# Patient Record
Sex: Female | Born: 1965 | Race: White | Hispanic: No | Marital: Married | Smoking: Never smoker
Health system: Southern US, Community
[De-identification: ages and names within clinical notes are randomized; demographics above are authoritative.]

## PROBLEM LIST (undated history)

## (undated) DIAGNOSIS — G629 Polyneuropathy, unspecified: Secondary | ICD-10-CM

## (undated) DIAGNOSIS — K769 Liver disease, unspecified: Secondary | ICD-10-CM

## (undated) DIAGNOSIS — F1010 Alcohol abuse, uncomplicated: Secondary | ICD-10-CM

## (undated) DIAGNOSIS — I509 Heart failure, unspecified: Secondary | ICD-10-CM

## (undated) HISTORY — PX: ABDOMINAL HYSTERECTOMY: SHX81

## (undated) HISTORY — PX: TONSILLECTOMY: SUR1361

## (undated) HISTORY — PX: BREAST LUMPECTOMY: SHX2

---

## 2002-08-19 ENCOUNTER — Emergency Department (HOSPITAL_COMMUNITY): Admission: EM | Admit: 2002-08-19 | Discharge: 2002-08-19 | Payer: Self-pay | Admitting: Emergency Medicine

## 2002-08-24 ENCOUNTER — Emergency Department (HOSPITAL_COMMUNITY): Admission: EM | Admit: 2002-08-24 | Discharge: 2002-08-24 | Payer: Self-pay | Admitting: Emergency Medicine

## 2002-09-19 ENCOUNTER — Emergency Department (HOSPITAL_COMMUNITY): Admission: EM | Admit: 2002-09-19 | Discharge: 2002-09-19 | Payer: Self-pay | Admitting: Emergency Medicine

## 2002-10-01 ENCOUNTER — Emergency Department (HOSPITAL_COMMUNITY): Admission: EM | Admit: 2002-10-01 | Discharge: 2002-10-01 | Payer: Self-pay | Admitting: Emergency Medicine

## 2002-10-16 ENCOUNTER — Emergency Department (HOSPITAL_COMMUNITY): Admission: EM | Admit: 2002-10-16 | Discharge: 2002-10-17 | Payer: Self-pay | Admitting: Emergency Medicine

## 2002-11-01 ENCOUNTER — Emergency Department (HOSPITAL_COMMUNITY): Admission: EM | Admit: 2002-11-01 | Discharge: 2002-11-01 | Payer: Self-pay | Admitting: Emergency Medicine

## 2002-11-14 ENCOUNTER — Emergency Department (HOSPITAL_COMMUNITY): Admission: EM | Admit: 2002-11-14 | Discharge: 2002-11-14 | Payer: Self-pay | Admitting: Emergency Medicine

## 2002-12-12 ENCOUNTER — Emergency Department (HOSPITAL_COMMUNITY): Admission: EM | Admit: 2002-12-12 | Discharge: 2002-12-12 | Payer: Self-pay | Admitting: Emergency Medicine

## 2002-12-21 ENCOUNTER — Emergency Department (HOSPITAL_COMMUNITY): Admission: EM | Admit: 2002-12-21 | Discharge: 2002-12-21 | Payer: Self-pay | Admitting: Emergency Medicine

## 2002-12-30 ENCOUNTER — Emergency Department (HOSPITAL_COMMUNITY): Admission: AD | Admit: 2002-12-30 | Discharge: 2002-12-30 | Payer: Self-pay | Admitting: Family Medicine

## 2003-01-01 ENCOUNTER — Emergency Department (HOSPITAL_COMMUNITY): Admission: AD | Admit: 2003-01-01 | Discharge: 2003-01-01 | Payer: Self-pay | Admitting: Family Medicine

## 2003-01-01 ENCOUNTER — Emergency Department (HOSPITAL_COMMUNITY): Admission: EM | Admit: 2003-01-01 | Discharge: 2003-01-01 | Payer: Self-pay | Admitting: Emergency Medicine

## 2003-01-22 ENCOUNTER — Emergency Department (HOSPITAL_COMMUNITY): Admission: EM | Admit: 2003-01-22 | Discharge: 2003-01-22 | Payer: Self-pay | Admitting: Emergency Medicine

## 2003-02-12 ENCOUNTER — Emergency Department (HOSPITAL_COMMUNITY): Admission: EM | Admit: 2003-02-12 | Discharge: 2003-02-12 | Payer: Self-pay | Admitting: Emergency Medicine

## 2003-03-02 ENCOUNTER — Emergency Department (HOSPITAL_COMMUNITY): Admission: EM | Admit: 2003-03-02 | Discharge: 2003-03-02 | Payer: Self-pay | Admitting: Emergency Medicine

## 2003-03-12 ENCOUNTER — Emergency Department (HOSPITAL_COMMUNITY): Admission: EM | Admit: 2003-03-12 | Discharge: 2003-03-12 | Payer: Self-pay | Admitting: Emergency Medicine

## 2003-03-21 ENCOUNTER — Emergency Department (HOSPITAL_COMMUNITY): Admission: EM | Admit: 2003-03-21 | Discharge: 2003-03-21 | Payer: Self-pay | Admitting: Emergency Medicine

## 2003-03-27 ENCOUNTER — Emergency Department (HOSPITAL_COMMUNITY): Admission: EM | Admit: 2003-03-27 | Discharge: 2003-03-28 | Payer: Self-pay | Admitting: Emergency Medicine

## 2003-04-05 ENCOUNTER — Emergency Department (HOSPITAL_COMMUNITY): Admission: EM | Admit: 2003-04-05 | Discharge: 2003-04-05 | Payer: Self-pay | Admitting: Emergency Medicine

## 2003-04-07 ENCOUNTER — Emergency Department (HOSPITAL_COMMUNITY): Admission: EM | Admit: 2003-04-07 | Discharge: 2003-04-07 | Payer: Self-pay | Admitting: Emergency Medicine

## 2003-12-31 ENCOUNTER — Ambulatory Visit: Payer: Self-pay | Admitting: Family Medicine

## 2004-08-04 ENCOUNTER — Ambulatory Visit: Payer: Self-pay | Admitting: Orthopedic Surgery

## 2004-12-27 ENCOUNTER — Emergency Department: Payer: Self-pay | Admitting: Unknown Physician Specialty

## 2005-11-04 ENCOUNTER — Emergency Department: Payer: Self-pay | Admitting: Emergency Medicine

## 2005-12-13 ENCOUNTER — Emergency Department: Payer: Self-pay | Admitting: Emergency Medicine

## 2005-12-27 ENCOUNTER — Emergency Department: Payer: Self-pay | Admitting: Emergency Medicine

## 2006-01-05 ENCOUNTER — Emergency Department: Payer: Self-pay | Admitting: Unknown Physician Specialty

## 2006-02-23 ENCOUNTER — Emergency Department: Payer: Self-pay | Admitting: Emergency Medicine

## 2006-02-28 ENCOUNTER — Emergency Department: Payer: Self-pay | Admitting: Emergency Medicine

## 2006-03-25 ENCOUNTER — Emergency Department: Payer: Self-pay

## 2006-03-26 ENCOUNTER — Emergency Department: Payer: Self-pay | Admitting: General Practice

## 2006-06-13 ENCOUNTER — Emergency Department: Payer: Self-pay | Admitting: Emergency Medicine

## 2006-09-20 ENCOUNTER — Emergency Department: Payer: Self-pay | Admitting: Emergency Medicine

## 2006-12-24 ENCOUNTER — Emergency Department: Payer: Self-pay | Admitting: Emergency Medicine

## 2007-02-27 ENCOUNTER — Emergency Department: Payer: Self-pay | Admitting: Emergency Medicine

## 2007-03-25 ENCOUNTER — Emergency Department: Payer: Self-pay | Admitting: Emergency Medicine

## 2007-06-30 ENCOUNTER — Emergency Department: Payer: Self-pay | Admitting: Emergency Medicine

## 2007-07-26 ENCOUNTER — Emergency Department: Payer: Self-pay | Admitting: Emergency Medicine

## 2007-08-24 ENCOUNTER — Emergency Department: Payer: Self-pay | Admitting: Emergency Medicine

## 2007-08-27 ENCOUNTER — Other Ambulatory Visit: Payer: Self-pay

## 2007-08-27 ENCOUNTER — Inpatient Hospital Stay: Payer: Self-pay | Admitting: Internal Medicine

## 2007-08-28 ENCOUNTER — Inpatient Hospital Stay: Payer: Self-pay | Admitting: Psychiatry

## 2007-10-21 ENCOUNTER — Emergency Department: Payer: Self-pay | Admitting: Emergency Medicine

## 2007-10-25 ENCOUNTER — Emergency Department: Payer: Self-pay | Admitting: Emergency Medicine

## 2007-12-14 ENCOUNTER — Other Ambulatory Visit: Payer: Self-pay

## 2007-12-14 ENCOUNTER — Emergency Department: Payer: Self-pay | Admitting: Emergency Medicine

## 2008-01-28 ENCOUNTER — Emergency Department: Payer: Self-pay | Admitting: Emergency Medicine

## 2008-01-29 ENCOUNTER — Emergency Department: Payer: Self-pay | Admitting: Emergency Medicine

## 2008-04-01 ENCOUNTER — Emergency Department: Payer: Self-pay | Admitting: Emergency Medicine

## 2008-04-03 ENCOUNTER — Emergency Department: Payer: Self-pay | Admitting: Emergency Medicine

## 2008-05-25 ENCOUNTER — Emergency Department: Payer: Self-pay | Admitting: Emergency Medicine

## 2008-05-30 ENCOUNTER — Emergency Department: Payer: Self-pay | Admitting: Emergency Medicine

## 2008-06-22 ENCOUNTER — Emergency Department: Payer: Self-pay | Admitting: Unknown Physician Specialty

## 2008-07-15 ENCOUNTER — Emergency Department: Payer: Self-pay | Admitting: Emergency Medicine

## 2008-07-18 ENCOUNTER — Emergency Department: Payer: Self-pay | Admitting: Internal Medicine

## 2008-07-19 ENCOUNTER — Emergency Department: Payer: Self-pay | Admitting: Emergency Medicine

## 2008-08-16 ENCOUNTER — Emergency Department: Payer: Self-pay | Admitting: Emergency Medicine

## 2008-08-19 ENCOUNTER — Inpatient Hospital Stay: Payer: Self-pay | Admitting: Internal Medicine

## 2008-08-20 ENCOUNTER — Inpatient Hospital Stay: Payer: Self-pay | Admitting: Psychiatry

## 2008-09-03 ENCOUNTER — Emergency Department: Payer: Self-pay | Admitting: Emergency Medicine

## 2008-09-06 ENCOUNTER — Emergency Department: Payer: Self-pay | Admitting: Emergency Medicine

## 2008-09-28 ENCOUNTER — Inpatient Hospital Stay: Payer: Self-pay | Admitting: Internal Medicine

## 2008-09-28 ENCOUNTER — Ambulatory Visit: Payer: Self-pay | Admitting: Cardiovascular Disease

## 2008-09-30 ENCOUNTER — Inpatient Hospital Stay: Payer: Self-pay | Admitting: Psychiatry

## 2008-11-05 ENCOUNTER — Emergency Department: Payer: Self-pay

## 2008-11-07 ENCOUNTER — Emergency Department: Payer: Self-pay | Admitting: Emergency Medicine

## 2008-12-22 ENCOUNTER — Emergency Department: Payer: Self-pay | Admitting: Emergency Medicine

## 2009-01-18 ENCOUNTER — Emergency Department: Payer: Self-pay | Admitting: Emergency Medicine

## 2009-02-16 ENCOUNTER — Emergency Department: Payer: Self-pay | Admitting: Emergency Medicine

## 2009-04-05 ENCOUNTER — Emergency Department: Payer: Self-pay | Admitting: Emergency Medicine

## 2010-12-15 ENCOUNTER — Emergency Department: Payer: Self-pay | Admitting: Internal Medicine

## 2011-03-10 ENCOUNTER — Emergency Department: Payer: Self-pay | Admitting: Unknown Physician Specialty

## 2012-03-17 ENCOUNTER — Emergency Department: Payer: Self-pay | Admitting: Emergency Medicine

## 2013-04-07 ENCOUNTER — Emergency Department: Payer: Self-pay | Admitting: Emergency Medicine

## 2013-04-07 LAB — DRUG SCREEN, URINE
AMPHETAMINES, UR SCREEN: NEGATIVE (ref ?–1000)
Barbiturates, Ur Screen: POSITIVE (ref ?–200)
Benzodiazepine, Ur Scrn: NEGATIVE (ref ?–200)
CANNABINOID 50 NG, UR ~~LOC~~: NEGATIVE (ref ?–50)
COCAINE METABOLITE, UR ~~LOC~~: NEGATIVE (ref ?–300)
MDMA (Ecstasy)Ur Screen: NEGATIVE (ref ?–500)
Methadone, Ur Screen: NEGATIVE (ref ?–300)
Opiate, Ur Screen: NEGATIVE (ref ?–300)
Phencyclidine (PCP) Ur S: NEGATIVE (ref ?–25)
TRICYCLIC, UR SCREEN: NEGATIVE (ref ?–1000)

## 2013-04-07 LAB — COMPREHENSIVE METABOLIC PANEL
ALK PHOS: 238 U/L — AB
ALT: 42 U/L (ref 12–78)
Albumin: 2.9 g/dL — ABNORMAL LOW (ref 3.4–5.0)
Anion Gap: 12 (ref 7–16)
BUN: 9 mg/dL (ref 7–18)
Bilirubin,Total: 0.7 mg/dL (ref 0.2–1.0)
CHLORIDE: 95 mmol/L — AB (ref 98–107)
Calcium, Total: 8.7 mg/dL (ref 8.5–10.1)
Co2: 27 mmol/L (ref 21–32)
Creatinine: 0.59 mg/dL — ABNORMAL LOW (ref 0.60–1.30)
Glucose: 112 mg/dL — ABNORMAL HIGH (ref 65–99)
Osmolality: 268 (ref 275–301)
Potassium: 3.3 mmol/L — ABNORMAL LOW (ref 3.5–5.1)
SGOT(AST): 62 U/L — ABNORMAL HIGH (ref 15–37)
SODIUM: 134 mmol/L — AB (ref 136–145)
TOTAL PROTEIN: 6.6 g/dL (ref 6.4–8.2)

## 2013-04-07 LAB — URINALYSIS, COMPLETE
BACTERIA: NONE SEEN
Glucose,UR: 50 mg/dL (ref 0–75)
Hyaline Cast: 14
NITRITE: NEGATIVE
Ph: 5 (ref 4.5–8.0)
Protein: 100
Specific Gravity: 1.026 (ref 1.003–1.030)
Squamous Epithelial: 31

## 2013-04-07 LAB — CBC
HCT: 35.3 % (ref 35.0–47.0)
HGB: 11.9 g/dL — ABNORMAL LOW (ref 12.0–16.0)
MCH: 37.6 pg — ABNORMAL HIGH (ref 26.0–34.0)
MCHC: 33.6 g/dL (ref 32.0–36.0)
MCV: 112 fL — AB (ref 80–100)
PLATELETS: 250 10*3/uL (ref 150–440)
RBC: 3.16 10*6/uL — AB (ref 3.80–5.20)
RDW: 17.7 % — AB (ref 11.5–14.5)
WBC: 8.2 10*3/uL (ref 3.6–11.0)

## 2013-04-07 LAB — ETHANOL: Ethanol %: <0.003 % (ref 0.000–0.080)

## 2013-04-07 LAB — TROPONIN I: Troponin-I: <0.02 ng/mL

## 2013-04-07 LAB — TSH: THYROID STIMULATING HORM: 1.6 u[IU]/mL

## 2013-04-07 LAB — ACETAMINOPHEN LEVEL: Acetaminophen: <2 ug/mL

## 2013-04-07 LAB — AMMONIA: AMMONIA, PLASMA: 22 umol/L (ref 11–32)

## 2013-04-07 LAB — SALICYLATE LEVEL: Salicylates, Serum: <1.7 mg/dL

## 2013-04-07 LAB — T4, FREE: Free Thyroxine: 1.4 ng/dL (ref 0.76–1.46)

## 2013-04-08 LAB — PREGNANCY, URINE: PREGNANCY TEST, URINE: NEGATIVE m[IU]/mL

## 2013-08-05 ENCOUNTER — Emergency Department: Payer: Self-pay | Admitting: Emergency Medicine

## 2014-05-14 ENCOUNTER — Emergency Department: Payer: Self-pay | Admitting: Emergency Medicine

## 2017-04-06 ENCOUNTER — Emergency Department
Admission: EM | Admit: 2017-04-06 | Discharge: 2017-04-07 | Disposition: A | Discharge status: Home / Self Care | Attending: Emergency Medicine | Admitting: Emergency Medicine

## 2017-04-06 ENCOUNTER — Encounter: Payer: Self-pay | Admitting: Emergency Medicine

## 2017-04-06 ENCOUNTER — Emergency Department

## 2017-04-06 DIAGNOSIS — Z7901 Long term (current) use of anticoagulants: Secondary | ICD-10-CM | POA: Diagnosis not present

## 2017-04-06 DIAGNOSIS — I509 Heart failure, unspecified: Secondary | ICD-10-CM | POA: Diagnosis not present

## 2017-04-06 DIAGNOSIS — F1010 Alcohol abuse, uncomplicated: Secondary | ICD-10-CM | POA: Insufficient documentation

## 2017-04-06 DIAGNOSIS — Z79899 Other long term (current) drug therapy: Secondary | ICD-10-CM | POA: Insufficient documentation

## 2017-04-06 DIAGNOSIS — R000 Tachycardia, unspecified: Secondary | ICD-10-CM | POA: Diagnosis not present

## 2017-04-06 DIAGNOSIS — Z046 Encounter for general psychiatric examination, requested by authority: Secondary | ICD-10-CM | POA: Diagnosis present

## 2017-04-06 DIAGNOSIS — F329 Major depressive disorder, single episode, unspecified: Secondary | ICD-10-CM | POA: Insufficient documentation

## 2017-04-06 HISTORY — DX: Polyneuropathy, unspecified: G62.9

## 2017-04-06 HISTORY — DX: Heart failure, unspecified: I50.9

## 2017-04-06 HISTORY — DX: Liver disease, unspecified: K76.9

## 2017-04-06 LAB — ETHANOL: Alcohol, Ethyl (B): 139 mg/dL — ABNORMAL HIGH (ref <10)

## 2017-04-06 LAB — URINE DRUG SCREEN, QUALITATIVE (ARMC ONLY)
AMPHETAMINES, UR SCREEN: NOT DETECTED
Barbiturates, Ur Screen: NOT DETECTED
Benzodiazepine, Ur Scrn: NOT DETECTED
COCAINE METABOLITE, UR ~~LOC~~: NOT DETECTED
Cannabinoid 50 Ng, Ur ~~LOC~~: NOT DETECTED
MDMA (Ecstasy)Ur Screen: NOT DETECTED
METHADONE SCREEN, URINE: NOT DETECTED
Opiate, Ur Screen: NOT DETECTED
PHENCYCLIDINE (PCP) UR S: NOT DETECTED
Tricyclic, Ur Screen: NOT DETECTED

## 2017-04-06 LAB — COMPREHENSIVE METABOLIC PANEL
ALBUMIN: 3.1 g/dL — AB (ref 3.5–5.0)
ALK PHOS: 212 U/L — AB (ref 38–126)
ALT: 51 U/L (ref 14–54)
ANION GAP: 6 (ref 5–15)
AST: 61 U/L — ABNORMAL HIGH (ref 15–41)
BUN: 17 mg/dL (ref 6–20)
CALCIUM: 8.5 mg/dL — AB (ref 8.9–10.3)
CO2: 31 mmol/L (ref 22–32)
Chloride: 104 mmol/L (ref 101–111)
Creatinine, Ser: 0.65 mg/dL (ref 0.44–1.00)
GFR calc Af Amer: >60 mL/min (ref >60)
GFR calc non Af Amer: >60 mL/min (ref >60)
GLUCOSE: 86 mg/dL (ref 65–99)
Potassium: 5.1 mmol/L (ref 3.5–5.1)
Sodium: 141 mmol/L (ref 135–145)
TOTAL PROTEIN: 6.7 g/dL (ref 6.5–8.1)
Total Bilirubin: 0.2 mg/dL — ABNORMAL LOW (ref 0.3–1.2)

## 2017-04-06 LAB — CBC
HCT: 35.8 % (ref 35.0–47.0)
Hemoglobin: 11.7 g/dL — ABNORMAL LOW (ref 12.0–16.0)
MCH: 28.8 pg (ref 26.0–34.0)
MCHC: 32.7 g/dL (ref 32.0–36.0)
MCV: 87.9 fL (ref 80.0–100.0)
Platelets: 400 10*3/uL (ref 150–440)
RBC: 4.07 MIL/uL (ref 3.80–5.20)
RDW: 21.3 % — ABNORMAL HIGH (ref 11.5–14.5)
WBC: 8.6 10*3/uL (ref 3.6–11.0)

## 2017-04-06 MED ORDER — OXYCODONE HCL 5 MG PO TABS
5.0000 mg | ORAL_TABLET | Freq: Once | ORAL | Status: AC
  Administered 2017-04-06: 5 mg via ORAL
  Filled 2017-04-06: qty 1

## 2017-04-06 NOTE — ED Notes (Addendum)
Pt resting in room; calm and cooperative; watching tv

## 2017-04-06 NOTE — ED Notes (Signed)
Patient's belongings locked up. Patient has 2 yellow color rings, one yellow color necklace with charm, 1 watch, glasses, cell phone and clothes.

## 2017-04-06 NOTE — ED Triage Notes (Signed)
[facility] Patient denies SI at this time.
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
## 2017-04-06 NOTE — ED Notes (Signed)
[facility]

## 2017-04-06 NOTE — ED Notes (Signed)
Pt resting in hallway bed, tearful; awaiting assessment

## 2017-04-06 NOTE — ED Notes (Signed)
Spoke with Dr Scotty CourtStafford regarding pt's request for Fioricet; pt understands that will not be ordered for her at this time; pt requesting "Tylenol or something"; will discuss with MD

## 2017-04-06 NOTE — ED Notes (Signed)
Pt resting in bed, calm and cooperative; understands no new orders for migraine; lights out for pt comfort; [facility]; pt says "I just want to go home"
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
## 2017-04-06 NOTE — ED Notes (Signed)
Pt out to desk asking for medication for migraine, says MD was going to order something; informed pt she was just given oxycodone but she says that does not work for her headache; will discuss with Dr Scotty CourtStafford

## 2017-04-06 NOTE — ED Notes (Signed)
Dr Scotty CourtStafford at bedside to assess

## 2017-04-06 NOTE — ED Notes (Signed)
Spoke with Dr Scotty CourtStafford regarding pt's request for "tylenol or something"; no new orders given at this time

## 2017-04-07 MED ORDER — SEVELAMER CARBONATE 800 MG PO TABS
800.0000 mg | ORAL_TABLET | Freq: Three times a day (TID) | ORAL | Status: DC
  Administered 2017-04-07 (×2): 800 mg via ORAL
  Filled 2017-04-07 (×2): qty 1

## 2017-04-07 MED ORDER — SODIUM CHLORIDE 0.9 % IV BOLUS (SEPSIS)
1000.0000 mL | Freq: Once | INTRAVENOUS | Status: AC
  Administered 2017-04-07: 1000 mL via INTRAVENOUS

## 2017-04-07 MED ORDER — RIFAXIMIN 550 MG PO TABS
550.0000 mg | ORAL_TABLET | Freq: Once | ORAL | Status: AC
  Administered 2017-04-07: 550 mg via ORAL
  Filled 2017-04-07: qty 1

## 2017-04-07 MED ORDER — CARVEDILOL 6.25 MG PO TABS
12.5000 mg | ORAL_TABLET | Freq: Two times a day (BID) | ORAL | Status: DC
  Administered 2017-04-07: 12.5 mg via ORAL

## 2017-04-07 MED ORDER — IBUPROFEN 800 MG PO TABS
800.0000 mg | ORAL_TABLET | Freq: Once | ORAL | Status: AC
  Administered 2017-04-07: 800 mg via ORAL
  Filled 2017-04-07: qty 1

## 2017-04-07 MED ORDER — GABAPENTIN 300 MG PO CAPS
300.0000 mg | ORAL_CAPSULE | Freq: Once | ORAL | Status: AC
  Administered 2017-04-07: 300 mg via ORAL
  Filled 2017-04-07: qty 1

## 2017-04-07 MED ORDER — LORAZEPAM 2 MG PO TABS
2.0000 mg | ORAL_TABLET | Freq: Once | ORAL | Status: AC
  Administered 2017-04-07: 2 mg via ORAL
  Filled 2017-04-07: qty 1

## 2017-04-07 MED ORDER — LORAZEPAM 2 MG/ML IJ SOLN
1.0000 mg | Freq: Once | INTRAMUSCULAR | Status: AC
  Administered 2017-04-07: 1 mg via INTRAVENOUS
  Filled 2017-04-07: qty 1

## 2017-04-07 MED ORDER — DULOXETINE HCL 60 MG PO CPEP
60.0000 mg | ORAL_CAPSULE | Freq: Once | ORAL | Status: AC
  Administered 2017-04-07: 60 mg via ORAL
  Filled 2017-04-07: qty 1

## 2017-04-07 MED ORDER — CARVEDILOL 6.25 MG PO TABS
ORAL_TABLET | ORAL | Status: AC
  Filled 2017-04-07: qty 2

## 2017-04-07 MED ORDER — LORAZEPAM 2 MG/ML IJ SOLN
2.0000 mg | Freq: Once | INTRAMUSCULAR | Status: AC
  Administered 2017-04-07: 2 mg via INTRAVENOUS
  Filled 2017-04-07: qty 1

## 2017-04-07 NOTE — ED Notes (Addendum)
Pt's HR reported to MD; will enter orders; pt aware she is going to get IV fluids and more ativan in hopes of lower her heart rate so she can be discharged

## 2017-04-07 NOTE — ED Provider Notes (Signed)
-----------------------------------------   2:10 AM on 04/07/2017 -----------------------------------------   Blood pressure (!) 150/89, pulse (!) 112, temperature (!) 97.5 F (36.4 C), temperature source Oral, resp. rate 18, height 5\' 4"  (1.626 m), weight 41.7 kg (92 lb), SpO2 100 %.  Assuming care from Dr. Scotty CourtStafford.  In short, Joanna Askewmanda Dixon Sanchez is a 52 y.o. female with a chief complaint of Psychiatric Evaluation .  Refer to the original H&P for additional details.  The current plan of care is to follow-up the recommendations of the tele-psychiatrist.   Clinical Course as of Apr 07 208  Sat Apr 06, 2017  2334 Xr left hand unremarkable, no fx.   [PS]    Clinical Course User Index [PS] Sharman CheekStafford, Phillip, MD   I discussed the patient's case with the tele-psychiatrist.  He reports that the patient does not want to be here and is not currently interested in inpatient alcohol rehab.  Although the patient's husband was frustrated he was willing to take her home.  The patient is not suicidal or homicidal and we are unable to maintain involuntary commitment due to substance abuse.  We will give the patient some resources in the attempt to help with her alcohol rehabilitation but we are unable to keep her in the hospital she is not suicidal or homicidal.  The patient's IVC will be rescinded.   Rebecka ApleyWebster, Joanna P, MD 04/07/17 [phone]

## 2017-04-07 NOTE — ED Notes (Signed)
[facility] is ready for assessment
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
## 2017-04-07 NOTE — ED Provider Notes (Signed)
-----------------------------------------   6:54 AM on 04/07/2017 -----------------------------------------   Blood pressure 121/69, pulse (!) 122, temperature (!) 97.5 F (36.4 C), temperature source Oral, resp. rate 17, height 5\' 4"  (1.626 m), weight 41.7 kg (92 lb), SpO2 98 %.  The patient had no acute events since last update.  Calm and cooperative at this time.    The patient was discharged according to the specialist on call but she is remained tachycardic.  We initially tried giving her her nighttime medications and then tried giving her some Ativan.  The patient's heart rate came down for small amount but then went right back up.  We then decided to have the patient orally hydrate which did not help so we are giving the patient some IV hydration.  And once her vital signs have improved she will be discharged home.     Joanna Sanchez, Farhad Burleson P, MD 04/07/17 [phone]
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
## 2017-04-07 NOTE — ED Notes (Signed)
SOC called and is speaking with Dr Zenda AlpersWebster about his recommendations;

## 2017-04-07 NOTE — ED Notes (Signed)
Pt given sandwich tray and water 
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
## 2017-04-07 NOTE — ED Notes (Signed)
Informed Dr Zenda AlpersWebster of pt's pulse and BP, as well as pt's c/o headache; pt requesting Fioricet but understands she cannot have that due to caffeine content; verbal order from MD for Ibuprofen and PO fluids;

## 2017-04-07 NOTE — ED Notes (Signed)
Pt sitting up in bed; HR 122; anxious about heart rate coming down so she can leave; pt encouraged to lay back, close her eyes and relax; take a nap if she can;

## 2017-04-07 NOTE — ED Notes (Signed)
[facility]

## 2017-04-07 NOTE — Discharge Instructions (Addendum)
Please follow up with [facility]. °

## 2017-04-07 NOTE — ED Provider Notes (Signed)
-----------------------------------------   8:32 AM on 04/07/2017 -----------------------------------------  I took signout on this patient from Dr. Zenda AlpersWebster.  The patient remains persistently tachycardic to around 120.  I reassessed the patient, and at this time she is alert, calm, and has no evidence on exam of withdrawal; there is no tongue fasciculation or asterixis.  She does endorse anxiety which I believe may be contributing to her tachycardia, and patient states that also her heart rate runs high at baseline.  She states she feels well and would like to go home.  She agrees to stay for an additional liter of fluid and 1 mg of Ativan IV, and then I will reassess.  Anticipate likely discharge home even if she continues to have mild tachycardia.  ----------------------------------------- 9:55 AM on 04/07/2017 -----------------------------------------  After second liter and Ativan, the patient's heart rate remains in the 110s.  She continues to say that she feels well and she would very much like to go home at this time.  There continues to be no findings consistent with alcohol withdrawal on exam.  There is no evidence of danger to self or others.  Patient will be given outpatient resources.  Return precautions given, and the patient expressed understanding.   Dionne BucySiadecki, Lexie Koehl, MD 04/07/17 [phone]

## 2017-04-07 NOTE — ED Notes (Addendum)
TTS assessment complete; pt up to bathroom with steady gait

## 2017-04-07 NOTE — ED Notes (Signed)
D/w Dr. Marisa SeverinSiadecki re: pt's vital signs, pt having no s/s of w/d pt states she is ready to go home. Phone given to patient to call for ride. Pt is steady on her feet.  Pt given belongings which includes clothing, jacket, rings, necklace, shoes, cell phone, watch and hair clip.  Pt will wait in room for ride to come.

## 2017-07-29 ENCOUNTER — Emergency Department

## 2017-07-29 ENCOUNTER — Encounter: Payer: Self-pay | Admitting: Emergency Medicine

## 2017-07-29 ENCOUNTER — Other Ambulatory Visit: Payer: Self-pay

## 2017-07-29 ENCOUNTER — Inpatient Hospital Stay
Admission: EM | Admit: 2017-07-29 | Discharge: 2017-08-03 | DRG: 896 | Disposition: A | Discharge status: Home / Self Care | Attending: Family Medicine | Admitting: Family Medicine

## 2017-07-29 DIAGNOSIS — I509 Heart failure, unspecified: Secondary | ICD-10-CM

## 2017-07-29 DIAGNOSIS — I313 Pericardial effusion (noninflammatory): Secondary | ICD-10-CM | POA: Diagnosis present

## 2017-07-29 DIAGNOSIS — G629 Polyneuropathy, unspecified: Secondary | ICD-10-CM | POA: Diagnosis present

## 2017-07-29 DIAGNOSIS — I426 Alcoholic cardiomyopathy: Secondary | ICD-10-CM | POA: Diagnosis present

## 2017-07-29 DIAGNOSIS — F329 Major depressive disorder, single episode, unspecified: Secondary | ICD-10-CM | POA: Diagnosis present

## 2017-07-29 DIAGNOSIS — Z9884 Bariatric surgery status: Secondary | ICD-10-CM

## 2017-07-29 DIAGNOSIS — F10939 Alcohol use, unspecified with withdrawal, unspecified: Secondary | ICD-10-CM

## 2017-07-29 DIAGNOSIS — F10931 Alcohol use, unspecified with withdrawal delirium: Secondary | ICD-10-CM

## 2017-07-29 DIAGNOSIS — I5043 Acute on chronic combined systolic (congestive) and diastolic (congestive) heart failure: Secondary | ICD-10-CM | POA: Diagnosis present

## 2017-07-29 DIAGNOSIS — Z79899 Other long term (current) drug therapy: Secondary | ICD-10-CM | POA: Diagnosis not present

## 2017-07-29 DIAGNOSIS — J9621 Acute and chronic respiratory failure with hypoxia: Secondary | ICD-10-CM | POA: Diagnosis present

## 2017-07-29 DIAGNOSIS — E876 Hypokalemia: Secondary | ICD-10-CM | POA: Diagnosis not present

## 2017-07-29 DIAGNOSIS — K7030 Alcoholic cirrhosis of liver without ascites: Secondary | ICD-10-CM | POA: Diagnosis present

## 2017-07-29 DIAGNOSIS — D638 Anemia in other chronic diseases classified elsewhere: Secondary | ICD-10-CM | POA: Diagnosis present

## 2017-07-29 DIAGNOSIS — F1010 Alcohol abuse, uncomplicated: Secondary | ICD-10-CM

## 2017-07-29 DIAGNOSIS — F10231 Alcohol dependence with withdrawal delirium: Secondary | ICD-10-CM | POA: Diagnosis present

## 2017-07-29 DIAGNOSIS — I110 Hypertensive heart disease with heart failure: Secondary | ICD-10-CM | POA: Diagnosis present

## 2017-07-29 DIAGNOSIS — Z888 Allergy status to other drugs, medicaments and biological substances status: Secondary | ICD-10-CM

## 2017-07-29 DIAGNOSIS — E860 Dehydration: Secondary | ICD-10-CM | POA: Diagnosis present

## 2017-07-29 DIAGNOSIS — K7460 Unspecified cirrhosis of liver: Secondary | ICD-10-CM

## 2017-07-29 DIAGNOSIS — R0600 Dyspnea, unspecified: Secondary | ICD-10-CM | POA: Diagnosis not present

## 2017-07-29 DIAGNOSIS — F10239 Alcohol dependence with withdrawal, unspecified: Secondary | ICD-10-CM

## 2017-07-29 DIAGNOSIS — I340 Nonrheumatic mitral (valve) insufficiency: Secondary | ICD-10-CM | POA: Diagnosis not present

## 2017-07-29 DIAGNOSIS — Z7901 Long term (current) use of anticoagulants: Secondary | ICD-10-CM

## 2017-07-29 DIAGNOSIS — Z8619 Personal history of other infectious and parasitic diseases: Secondary | ICD-10-CM

## 2017-07-29 DIAGNOSIS — I361 Nonrheumatic tricuspid (valve) insufficiency: Secondary | ICD-10-CM | POA: Diagnosis not present

## 2017-07-29 DIAGNOSIS — F419 Anxiety disorder, unspecified: Secondary | ICD-10-CM | POA: Diagnosis present

## 2017-07-29 HISTORY — DX: Alcohol abuse, uncomplicated: F10.10

## 2017-07-29 LAB — CBC WITH DIFFERENTIAL/PLATELET
Basophils Absolute: 0.1 10*3/uL (ref 0–0.1)
Basophils Relative: 2 %
EOS PCT: 1 %
Eosinophils Absolute: 0.1 10*3/uL (ref 0–0.7)
HCT: 33 % — ABNORMAL LOW (ref 35.0–47.0)
Hemoglobin: 10.6 g/dL — ABNORMAL LOW (ref 12.0–16.0)
LYMPHS ABS: 1.7 10*3/uL (ref 1.0–3.6)
LYMPHS PCT: 30 %
MCH: 29.6 pg (ref 26.0–34.0)
MCHC: 32.2 g/dL (ref 32.0–36.0)
MCV: 92 fL (ref 80.0–100.0)
MONO ABS: 0.3 10*3/uL (ref 0.2–0.9)
Monocytes Relative: 6 %
Neutro Abs: 3.5 10*3/uL (ref 1.4–6.5)
Neutrophils Relative %: 61 %
PLATELETS: 215 10*3/uL (ref 150–440)
RBC: 3.59 MIL/uL — ABNORMAL LOW (ref 3.80–5.20)
RDW: 16.2 % — AB (ref 11.5–14.5)
WBC: 5.7 10*3/uL (ref 3.6–11.0)

## 2017-07-29 LAB — COMPREHENSIVE METABOLIC PANEL
ALBUMIN: 3.5 g/dL (ref 3.5–5.0)
ALT: 64 U/L — ABNORMAL HIGH (ref 14–54)
AST: 198 U/L — AB (ref 15–41)
Alkaline Phosphatase: 480 U/L — ABNORMAL HIGH (ref 38–126)
Anion gap: 11 (ref 5–15)
BILIRUBIN TOTAL: 0.9 mg/dL (ref 0.3–1.2)
BUN: 12 mg/dL (ref 6–20)
CHLORIDE: 103 mmol/L (ref 101–111)
CO2: 27 mmol/L (ref 22–32)
Calcium: 8.1 mg/dL — ABNORMAL LOW (ref 8.9–10.3)
Creatinine, Ser: 0.48 mg/dL (ref 0.44–1.00)
GFR calc Af Amer: >60 mL/min (ref >60)
GFR calc non Af Amer: >60 mL/min (ref >60)
GLUCOSE: 120 mg/dL — AB (ref 65–99)
POTASSIUM: 4.3 mmol/L (ref 3.5–5.1)
Sodium: 141 mmol/L (ref 135–145)
Total Protein: 6.7 g/dL (ref 6.5–8.1)

## 2017-07-29 LAB — BRAIN NATRIURETIC PEPTIDE: B Natriuretic Peptide: 2509 pg/mL — ABNORMAL HIGH (ref 0.0–100.0)

## 2017-07-29 LAB — MRSA PCR SCREENING: MRSA by PCR: NEGATIVE

## 2017-07-29 LAB — GLUCOSE, CAPILLARY: Glucose-Capillary: 108 mg/dL — ABNORMAL HIGH (ref 65–99)

## 2017-07-29 LAB — TROPONIN I

## 2017-07-29 MED ORDER — THIAMINE HCL 100 MG/ML IJ SOLN: 100.0000 mg | Freq: Every day | INTRAMUSCULAR | Status: DC

## 2017-07-29 MED ORDER — ONDANSETRON HCL 4 MG/2ML IJ SOLN
4.0000 mg | Freq: Four times a day (QID) | INTRAMUSCULAR | Status: DC | PRN
  Administered 2017-07-29 – 2017-08-01 (×2): 4 mg via INTRAVENOUS
  Filled 2017-07-29 (×2): qty 2

## 2017-07-29 MED ORDER — LABETALOL HCL 5 MG/ML IV SOLN
10.0000 mg | INTRAVENOUS | Status: DC | PRN
  Administered 2017-07-29: 10 mg via INTRAVENOUS
  Filled 2017-07-29: qty 4

## 2017-07-29 MED ORDER — IPRATROPIUM-ALBUTEROL 0.5-2.5 (3) MG/3ML IN SOLN
3.0000 mL | Freq: Four times a day (QID) | RESPIRATORY_TRACT | Status: DC
  Administered 2017-07-30 – 2017-07-31 (×6): 3 mL via RESPIRATORY_TRACT
  Filled 2017-07-29 (×6): qty 3

## 2017-07-29 MED ORDER — OXYCODONE HCL 5 MG PO TABS
5.0000 mg | ORAL_TABLET | ORAL | Status: DC | PRN
  Administered 2017-07-30 – 2017-08-03 (×5): 5 mg via ORAL
  Filled 2017-07-29 (×6): qty 1

## 2017-07-29 MED ORDER — GABAPENTIN 300 MG PO CAPS
300.0000 mg | ORAL_CAPSULE | Freq: Three times a day (TID) | ORAL | Status: DC
  Administered 2017-07-29 – 2017-08-03 (×14): 300 mg via ORAL
  Filled 2017-07-29 (×14): qty 1

## 2017-07-29 MED ORDER — ACETAMINOPHEN 650 MG RE SUPP: 650.0000 mg | Freq: Four times a day (QID) | RECTAL | Status: DC | PRN

## 2017-07-29 MED ORDER — ENOXAPARIN SODIUM 40 MG/0.4ML ~~LOC~~ SOLN
40.0000 mg | SUBCUTANEOUS | Status: DC
  Administered 2017-07-30 – 2017-08-02 (×4): 40 mg via SUBCUTANEOUS
  Filled 2017-07-29 (×4): qty 0.4

## 2017-07-29 MED ORDER — DULOXETINE HCL 60 MG PO CPEP
60.0000 mg | ORAL_CAPSULE | Freq: Two times a day (BID) | ORAL | Status: DC
  Administered 2017-07-29 – 2017-08-03 (×10): 60 mg via ORAL
  Filled 2017-07-29 (×4): qty 1
  Filled 2017-07-29: qty 2
  Filled 2017-07-29 (×2): qty 1
  Filled 2017-07-29: qty 2
  Filled 2017-07-29 (×3): qty 1

## 2017-07-29 MED ORDER — RIFAXIMIN 550 MG PO TABS
550.0000 mg | ORAL_TABLET | Freq: Two times a day (BID) | ORAL | Status: DC
  Administered 2017-07-29 – 2017-08-03 (×10): 550 mg via ORAL
  Filled 2017-07-29 (×10): qty 1

## 2017-07-29 MED ORDER — ACETAMINOPHEN 325 MG PO TABS: 650.0000 mg | ORAL_TABLET | Freq: Four times a day (QID) | ORAL | Status: DC | PRN

## 2017-07-29 MED ORDER — QUETIAPINE FUMARATE 25 MG PO TABS
100.0000 mg | ORAL_TABLET | Freq: Every day | ORAL | Status: DC
  Administered 2017-07-29 – 2017-08-02 (×5): 100 mg via ORAL
  Filled 2017-07-29 (×5): qty 4

## 2017-07-29 MED ORDER — THIAMINE HCL 100 MG/ML IJ SOLN
100.0000 mg | Freq: Once | INTRAMUSCULAR | Status: AC
  Administered 2017-07-29: 100 mg via INTRAVENOUS
  Filled 2017-07-29: qty 2

## 2017-07-29 MED ORDER — FUROSEMIDE 10 MG/ML IJ SOLN
60.0000 mg | Freq: Two times a day (BID) | INTRAMUSCULAR | Status: DC
  Administered 2017-07-29 – 2017-07-31 (×4): 60 mg via INTRAVENOUS
  Filled 2017-07-29 (×3): qty 6
  Filled 2017-07-29: qty 8

## 2017-07-29 MED ORDER — BUTALBITAL-APAP-CAFFEINE 50-325-40 MG PO TABS
1.0000 | ORAL_TABLET | Freq: Once | ORAL | Status: AC
  Administered 2017-07-30: 1 via ORAL
  Filled 2017-07-29: qty 1

## 2017-07-29 MED ORDER — LORAZEPAM 2 MG/ML IJ SOLN
2.0000 mg | INTRAMUSCULAR | Status: AC | PRN
  Administered 2017-07-29 – 2017-08-01 (×9): 2 mg via INTRAVENOUS
  Filled 2017-07-29 (×9): qty 1

## 2017-07-29 MED ORDER — LORAZEPAM 2 MG/ML IJ SOLN
2.0000 mg | Freq: Once | INTRAMUSCULAR | Status: AC
  Administered 2017-07-29: 2 mg via INTRAVENOUS
  Filled 2017-07-29: qty 1

## 2017-07-29 MED ORDER — ADULT MULTIVITAMIN W/MINERALS CH
1.0000 | ORAL_TABLET | Freq: Every day | ORAL | Status: DC
  Administered 2017-07-30 – 2017-08-03 (×5): 1 via ORAL
  Filled 2017-07-29 (×5): qty 1

## 2017-07-29 MED ORDER — CARVEDILOL 12.5 MG PO TABS
12.5000 mg | ORAL_TABLET | Freq: Two times a day (BID) | ORAL | Status: DC
  Administered 2017-07-30 – 2017-08-03 (×9): 12.5 mg via ORAL
  Filled 2017-07-29 (×9): qty 1

## 2017-07-29 MED ORDER — SODIUM CHLORIDE 0.9% FLUSH
3.0000 mL | Freq: Two times a day (BID) | INTRAVENOUS | Status: DC
  Administered 2017-07-30 – 2017-08-03 (×9): 3 mL via INTRAVENOUS

## 2017-07-29 MED ORDER — ONDANSETRON HCL 4 MG PO TABS
4.0000 mg | ORAL_TABLET | Freq: Four times a day (QID) | ORAL | Status: DC | PRN
  Administered 2017-07-30 – 2017-08-02 (×2): 4 mg via ORAL
  Filled 2017-07-29 (×2): qty 1

## 2017-07-29 MED ORDER — PANTOPRAZOLE SODIUM 40 MG PO TBEC
40.0000 mg | DELAYED_RELEASE_TABLET | Freq: Two times a day (BID) | ORAL | Status: DC
  Administered 2017-07-29 – 2017-08-03 (×10): 40 mg via ORAL
  Filled 2017-07-29 (×10): qty 1

## 2017-07-29 MED ORDER — IPRATROPIUM-ALBUTEROL 0.5-2.5 (3) MG/3ML IN SOLN: 3.0000 mL | Freq: Four times a day (QID) | RESPIRATORY_TRACT | Status: DC

## 2017-07-29 MED ORDER — FUROSEMIDE 10 MG/ML IJ SOLN
20.0000 mg | Freq: Once | INTRAMUSCULAR | Status: AC
  Administered 2017-07-29: 20 mg via INTRAVENOUS
  Filled 2017-07-29: qty 4

## 2017-07-29 MED ORDER — METOPROLOL TARTRATE 50 MG PO TABS
50.0000 mg | ORAL_TABLET | Freq: Two times a day (BID) | ORAL | Status: DC
  Administered 2017-07-29 – 2017-07-30 (×2): 50 mg via ORAL
  Filled 2017-07-29 (×2): qty 1

## 2017-07-29 MED ORDER — SUCRALFATE 1 G PO TABS
1.0000 g | ORAL_TABLET | Freq: Four times a day (QID) | ORAL | Status: DC
  Administered 2017-07-29 – 2017-08-03 (×18): 1 g via ORAL
  Filled 2017-07-29 (×18): qty 1

## 2017-07-29 MED ORDER — SODIUM CHLORIDE 0.9 % IV SOLN
1.0000 mg | Freq: Once | INTRAVENOUS | Status: DC
  Filled 2017-07-29: qty 0.2

## 2017-07-29 MED ORDER — CHLORDIAZEPOXIDE HCL 5 MG PO CAPS
25.0000 mg | ORAL_CAPSULE | Freq: Three times a day (TID) | ORAL | Status: AC
  Administered 2017-07-29 – 2017-08-01 (×9): 25 mg via ORAL
  Filled 2017-07-29: qty 1
  Filled 2017-07-29 (×3): qty 5
  Filled 2017-07-29: qty 1
  Filled 2017-07-29 (×3): qty 5
  Filled 2017-07-29: qty 1

## 2017-07-29 MED ORDER — ALBUTEROL SULFATE (2.5 MG/3ML) 0.083% IN NEBU: 2.5000 mg | INHALATION_SOLUTION | RESPIRATORY_TRACT | Status: DC | PRN

## 2017-07-29 MED ORDER — BUTALBITAL-APAP-CAFFEINE 50-325-40 MG PO TABS: 1.0000 | ORAL_TABLET | Freq: Two times a day (BID) | ORAL | Status: DC | PRN

## 2017-07-29 MED ORDER — LORAZEPAM 2 MG PO TABS
2.0000 mg | ORAL_TABLET | ORAL | Status: AC | PRN
  Administered 2017-07-30 – 2017-08-01 (×3): 2 mg via ORAL
  Filled 2017-07-29: qty 2
  Filled 2017-07-29: qty 1
  Filled 2017-07-29: qty 2

## 2017-07-29 MED ORDER — VITAMIN B-1 100 MG PO TABS
100.0000 mg | ORAL_TABLET | Freq: Every day | ORAL | Status: DC
  Administered 2017-07-30 – 2017-08-01 (×3): 100 mg via ORAL
  Filled 2017-07-29 (×5): qty 1

## 2017-07-29 MED ORDER — FOLIC ACID 1 MG PO TABS
1.0000 mg | ORAL_TABLET | Freq: Every day | ORAL | Status: DC
  Administered 2017-07-30 – 2017-08-03 (×5): 1 mg via ORAL
  Filled 2017-07-29 (×5): qty 1

## 2017-07-29 MED ORDER — LOPERAMIDE HCL 2 MG PO CAPS
2.0000 mg | ORAL_CAPSULE | Freq: Three times a day (TID) | ORAL | Status: DC | PRN
  Administered 2017-07-30: 2 mg via ORAL
  Filled 2017-07-29 (×2): qty 1

## 2017-07-29 NOTE — Consult Note (Signed)
Name: Joanna Sanchez MRN: [id number] DOB: Sep 02, 1965    ADMISSION DATE:  07/29/2017 CONSULTATION DATE: 07/29/2017  REFERRING MD : Dr. 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  She presented to [facility] ER on 05/6 with shortness of breath onset the morning of 05/6.  Per ER notes the pt reported a 10 pound weight gain.  She has a hx of ETOH abuse attempted to abstain from alcohol for 2 months, however she started drinking again over the last 4 months.  On average she drinks 1 large bottle of liquor daily, her last drink was 24 hours prior to presentation.  She had a long hospitalization at [facility] in Sept 2018 due to CHF exacerbation and renal failure requiring dialysis.  During current ER visit CXR revealed pulmonary edema and small bilateral pleural effusions. Labs resulted alk phos 480, AST 198, ALT 64, BNP 2,509, and hgb 10.6.  She was subsequently admitted to the stepdown unit by hospitalist team for further workup and treatment.   PAST MEDICAL HISTORY :   has a past medical history of Alcohol abuse, CHF (congestive heart failure) ([place]), Liver disease, and Neuropathy.  has a past surgical history that includes Abdominal hysterectomy; Breast lumpectomy (Right); and Tonsillectomy. Prior to Admission medications   Medication Sig Start Date End Date Taking? Authorizing Provider  Biotin (BIOTIN 5000) 5 MG CAPS Take 1 capsule by mouth daily.   Yes [provider]  butalbital-acetaminophen-caffeine (FIORICET, ESGIC) 50-325-40 MG tablet Take 1 tablet by mouth 2 (two) times  daily as needed for headache or migraine.  03/07/17 03/07/18 Yes [provider]  carvedilol (COREG) 12.5 MG tablet Take 12.5 mg by mouth 2 (two) times daily with a meal.   Yes [provider]  cetirizine (ZYRTEC) 10 MG tablet Take 1 tablet by mouth daily. 08/21/16  Yes [provider]  DULoxetine (CYMBALTA) 60 MG capsule Take 60 mg by mouth 2 (two) times daily.   Yes [provider]  gabapentin (NEURONTIN) 300 MG capsule Take 300 mg by mouth 3 (three) times daily.   Yes [provider]  Multiple Vitamins-Minerals (CENTRUM ULTRA WOMENS) TABS Take 1 tablet by mouth daily.   Yes [provider]  pantoprazole (PROTONIX) 40 MG tablet Take 1 tablet by mouth 2 (two) times daily. 05/09/17  Yes [provider]  QUEtiapine (SEROQUEL) 100 MG tablet Take 100 mg by mouth at bedtime.   Yes [provider]  rifaximin (XIFAXAN) 550 MG TABS tablet Take 550 mg by mouth 2 (two) times daily.   Yes [provider]  sucralfate (CARAFATE) 1 g tablet Take 1 tablet by mouth 4 (four) times daily. 07/03/17  Yes [provider]  loperamide (IMODIUM) 2 MG capsule Take 2 mg by mouth 3 (three) times daily as needed for diarrhea or loose stools.    [provider]  ondansetron (ZOFRAN) 4 MG tablet Take 4 mg by mouth every 8 (eight) hours as needed for nausea or vomiting.    [provider]  oxyCODONE (OXY IR/ROXICODONE) 5 MG immediate release tablet Take 5  mg by mouth 2 (two) times daily as needed for moderate pain, severe pain or breakthrough pain.     [provider]   Allergies  Allergen Reactions  . Eliquis [Apixaban] Anaphylaxis  . Niacin And Related   . Other     Patient states that she had an anaphylactic reaction to a blood thinner but does not remember the name of it     FAMILY HISTORY:  family history includes Diabetes in her father. SOCIAL HISTORY:  reports that she has never smoked. She has never  used smokeless tobacco. She reports that she drinks alcohol. She reports that she does not use drugs.  REVIEW OF SYSTEMS: Positives in BOLD  Constitutional: fever, chills, weight loss, weight gain, malaise/fatigue and diaphoresis.  HENT: Negative for hearing loss, ear pain, nosebleeds, congestion, sore throat, neck pain, tinnitus and ear discharge.   Eyes: Negative for blurred vision, double vision, photophobia, pain, discharge and redness.  Respiratory: cough, hemoptysis, sputum production, shortness of breath, wheezing and stridor.   Cardiovascular: Negative for chest pain, palpitations, orthopnea, claudication, leg swelling and PND.  Gastrointestinal: Negative for heartburn, nausea, vomiting, abdominal pain, diarrhea, constipation, blood in stool and melena.  Genitourinary: Negative for dysuria, urgency, frequency, hematuria and flank pain.  Musculoskeletal: Negative for myalgias, back pain, joint pain and falls.  Skin: Negative for itching and rash.  Neurological: dizziness, tingling, tremors, sensory change, speech change, focal weakness, seizures, loss of consciousness, weakness and headaches.  Endo/Heme/Allergies: Negative for environmental allergies and polydipsia. Does not bruise/bleed easily.  SUBJECTIVE:  Pt c/o headache   VITAL SIGNS: Temp:  [98.6 F (37 C)] 98.6 F (37 C) (05/06 1612) Pulse Rate:  [100-133] 100 (05/06 1930) Resp:  [17-32] 20 (05/06 2045) BP: (107-168)/(67-127) 107/96 (05/06 2045) SpO2:  [93 %-100 %] 98 % (05/06 1930) Weight:  [49.9 kg (110 lb)] 49.9 kg (110 lb) (05/06 1613)  PHYSICAL EXAMINATION: General: well developed, well nourished, NAD  Neuro: alert and oriented, follows commands, bilateral dilated pupils 4 mm sluggish  HEENT: supple, no JVD Cardiovascular: sinus tach with BBB, no M/R/G Lungs: diffuse crackles and wheezes throughout, even, non labored  Abdomen: +BS x4, soft, mild tenderness, non distended  Musculoskeletal: normal bulk and tone, no  edema  Skin: intact no rashes or lesions   Recent Labs  Lab 07/29/17 1628  NA 141  K 4.3  CL 103  CO2 27  BUN 12  CREATININE 0.48  GLUCOSE 120*   Recent Labs  Lab 07/29/17 1628  HGB 10.6*  HCT 33.0*  WBC 5.7  PLT 215   Dg Chest 2 View  Result Date: 07/29/2017 CLINICAL DATA:  52 year old female with shortness of breath and history of congestive heart failure EXAM: CHEST - 2 VIEW COMPARISON:  Prior chest x-ray 04/07/2013 FINDINGS: Cardiomegaly. Bilateral interstitial airspace opacities in a predominantly perihilar distribution. Additionally, there appear to be small bilateral layering pleural effusions and associated bibasilar airspace opacities. Of note, the left perihilar soft tissues are slightly more full than the right. No pneumothorax. No acute osseous abnormality. IMPRESSION: 1. Findings are most suggestive of mild CHF with cardiomegaly, mild interstitial edema, small bilateral pleural effusions and bibasilar atelectasis. 2. However, the left hilar soft tissues are slightly more prominent than the right. While this may simply be due to atelectasis superimposed on the underlying pulmonary edema, a follow-up PA and lateral chest x-ray is recommended once the patient's CHF has been treated to exclude the possibility of an underlying hilar mass. Electronically Signed  By: Jacqulynn Cadet M.D.   On: 07/29/2017 16:45    ASSESSMENT / PLAN: Acute on chronic hypoxic respiratory failure secondary to CHF exacerbation  Transaminitis secondary to chronic liver disease and ETOH abuse Anemia without obvious acute blood loss ETOH Withdrawal  P: Supplemental O2 for dyspnea and/or hypoxia Prn bronchodilator therapy IV lasix  Repeat CXR in am  Echo pending  Continuous telemetry monitoring  Trend WBC and monitor fever curve Trend CMP Replace electrolytes as indicated  Monitor UOP  VTE px: subq lovenox Trend CBC  Monitor for s/sx of bleeding and transfuse for hgb <7 CIWA protocol  implemented   Marda Stalker, [place] Pager [phone] (please enter 7 digits) PCCM Consult Pager [phone] (please enter 7 digits)

## 2017-07-29 NOTE — ED Provider Notes (Signed)
[facility] Emergency Department Provider Note  ____________________________________________   First MD Initiated Contact with Patient 07/29/17 1805     (approximate)  I have reviewed the triage vital signs and the nursing notes.   HISTORY  Chief Complaint Shortness of Breath  Level 5 caveat:  history/ROS limited by acute/critical illness  HPI Joanna Sanchez is a 52 y.o. female with medical history as listed below which notably includes ongoing alcohol abuse and congestive heart failure who presents with severe respiratory distress.  She states that since this morning she has been unable to catch a deep breath which makes her anxiety worse.  However she also reports that over the last week she has gained 10 pounds.  She does not feel like she is holding the weight in her legs or any particular area but overall she feels like this is a fluid retention issue.  She has not been on Lasix for at least 6 months.  She was admitted for an extended period of time at [facility] over the last year and has had multiple issues with CHF, intubation, and renal failure.  She also started drinking alcohol again within the last 4 months and has been seen at this facility for alcohol abuse and psychiatric evaluation but she was found to not warrant inpatient psychiatric treatment and refused additional alcohol treatment care.  She reports that she has been trying to drink less and has not had a drink for more than 24 hours.  Her shortness of breath is severe and she is speaking in short clipped sentences.  She says she feels like she cannot take a deep breath and breathing deeply does not help.  She denies chest pain, nausea, vomiting, and abdominal pain.  She is diaphoretic and pale.  Her husband is at bedside and confirms the history and provides some additional history as well.  She denies any use of tobacco in the past and denies a history of COPD.  Past Medical History:  Diagnosis  Date  . Alcohol abuse   . CHF (congestive heart failure) (HCC)   . Liver disease   . Neuropathy     There are no active problems to display for this patient.   Past Surgical History:  Procedure Laterality Date  . ABDOMINAL HYSTERECTOMY    . BREAST LUMPECTOMY Right   . TONSILLECTOMY      Prior to Admission medications   Medication Sig Start Date End Date Taking? Authorizing Provider  carvedilol (COREG) 12.5 MG tablet Take 12.5 mg by mouth 2 (two) times daily with a meal.    [provider]  DULoxetine (CYMBALTA) 60 MG capsule Take 60 mg by mouth 2 (two) times daily.    [provider]  famotidine (PEPCID) 20 MG tablet Take 20 mg by mouth 2 (two) times daily as needed for heartburn or indigestion.    [provider]  gabapentin (NEURONTIN) 300 MG capsule Take 300 mg by mouth 3 (three) times daily.    [provider]  loperamide (IMODIUM) 2 MG capsule Take 2 mg by mouth 3 (three) times daily as needed for diarrhea or loose stools.    [provider]  loratadine (CLARITIN) 10 MG tablet Take 10 mg by mouth daily.    [provider]  Multiple Vitamin (MULTIVITAMIN) capsule Take 1 capsule by mouth daily.    [provider]  ondansetron (ZOFRAN) 4 MG tablet Take 4 mg by mouth every 8 (eight) hours as needed for nausea  or vomiting.    [provider]  oxyCODONE (OXY IR/ROXICODONE) 5 MG immediate release tablet Take 5 mg by mouth 2 (two) times daily.    [provider]  QUEtiapine (SEROQUEL) 100 MG tablet Take 100 mg by mouth at bedtime.    [provider]  rifaximin (XIFAXAN) 550 MG TABS tablet Take 550 mg by mouth 2 (two) times daily.    [provider]  rivaroxaban (XARELTO) 20 MG TABS tablet Take 20 mg by mouth every morning.    [provider]  sevelamer carbonate (RENVELA) 800 MG tablet Take 800 mg by mouth 3 (three) times daily with meals.    [provider]     Allergies Eliquis [apixaban]; Niacin and related; and Other  History reviewed. No pertinent family history.  Social History Social History   Tobacco Use  . Smoking status: Never Smoker  . Smokeless tobacco: Never Used  Substance Use Topics  . Alcohol use: Yes  . Drug use: No    Review of Systems Level 5 caveat:  history/ROS limited by acute/critical illness  Constitutional: No fever/chills Eyes: No visual changes. ENT: No sore throat. Cardiovascular: Denies chest pain. Respiratory: Severe respiratory distress, cannot get a deep breath.  Reports 10 pound weight gain in the last week. Gastrointestinal: No abdominal pain.  No nausea, no vomiting.  No diarrhea.  No constipation. Genitourinary: Negative for dysuria. Musculoskeletal: Negative for neck pain.  Negative for back pain. Integumentary: Negative for rash. Neurological: Negative for headaches, focal weakness or numbness.  Recently has not been drinking alcohol with a history of heavy alcohol abuse.   ____________________________________________   PHYSICAL EXAM:  VITAL SIGNS: ED Triage Vitals  Enc Vitals Group     BP 07/29/17 1612 (!) 138/97     Pulse Rate 07/29/17 1612 (!) 122     Resp 07/29/17 1612 20     Temp 07/29/17 1612 98.6 F (37 C)     Temp Source 07/29/17 1612 Oral     SpO2 07/29/17 1612 94 %     Weight 07/29/17 1613 49.9 kg (110 lb)     Height 07/29/17 1613 1.626 m ( )     Head Circumference --      Peak Flow --      Pain Score 07/29/17 1613 8     Pain Loc --      Pain Edu? --      Excl. in GC? --     Constitutional: Alert and oriented.  Appears acutely ill on top of chronic medical issues.  Severe respiratory distress. Eyes: Conjunctivae are normal.  Head: Atraumatic. Nose: No congestion/rhinnorhea. Mouth/Throat: Mucous membranes are moist. Neck: No stridor.  No meningeal signs.   Cardiovascular: Tachycardia at about 130, regular rhythm. Good peripheral circulation. Grossly normal  heart sounds. Respiratory: Increased respiratory rate and effort with intercostal muscle retractions and "auto peeping" with pursed lips.  Coarse breath sounds throughout lung fields, no wheezing.  Decreased air movement. Gastrointestinal: Soft and nontender. No distention.  Musculoskeletal: No lower extremity tenderness nor edema. No gross deformities of extremities. Neurologic:  Normal speech and language. No gross focal neurologic deficits are appreciated.  Skin:  Skin is pale and diaphoretic. Psychiatric: Mood and affect are anxious and upset but generally appropriate under the circumstances.  ____________________________________________   LABS (all labs ordered are listed, but only abnormal results are displayed)  Labs Reviewed  COMPREHENSIVE METABOLIC PANEL - Abnormal; Notable for the following components:      Result  Value   Glucose, Bld 120 (*)    Calcium 8.1 (*)    AST 198 (*)    ALT 64 (*)    Alkaline Phosphatase 480 (*)    All other components within normal limits  CBC WITH DIFFERENTIAL/PLATELET - Abnormal; Notable for the following components:   RBC 3.59 (*)    Hemoglobin 10.6 (*)    HCT 33.0 (*)    RDW 16.2 (*)    All other components within normal limits  TROPONIN I  BRAIN NATRIURETIC PEPTIDE   ____________________________________________  EKG  ED ECG REPORT I, Loleta Rose, the attending physician, personally viewed and interpreted this ECG.  Date: 07/29/2017 EKG Time: 16: 20 Rate: 122 Rhythm: Sinus tachycardia QRS Axis: Left axis deviation Intervals: Left bundle branch block ST/T Wave abnormalities: Non-specific ST segment / T-wave changes, but no evidence of acute ischemia. Narrative Interpretation: no evidence of acute ischemia, but left bundle branch block is new since the last EKG.   ____________________________________________  RADIOLOGY Marylou Mccoy, personally viewed and evaluated these images (plain radiographs) as part of my medical  decision making, as well as reviewing the written report by the radiologist.  ED MD interpretation: Interstitial edema  Official radiology report(s): Dg Chest 2 View  Result Date: 07/29/2017 CLINICAL DATA:  52 year old female with shortness of breath and history of congestive heart failure EXAM: CHEST - 2 VIEW COMPARISON:  Prior chest x-ray 04/07/2013 FINDINGS: Cardiomegaly. Bilateral interstitial airspace opacities in a predominantly perihilar distribution. Additionally, there appear to be small bilateral layering pleural effusions and associated bibasilar airspace opacities. Of note, the left perihilar soft tissues are slightly more full than the right. No pneumothorax. No acute osseous abnormality. IMPRESSION: 1. Findings are most suggestive of mild CHF with cardiomegaly, mild interstitial edema, small bilateral pleural effusions and bibasilar atelectasis. 2. However, the left hilar soft tissues are slightly more prominent than the right. While this may simply be due to atelectasis superimposed on the underlying pulmonary edema, a follow-up PA and lateral chest x-ray is recommended once the patient's CHF has been treated to exclude the possibility of an underlying hilar mass. Electronically Signed   By: Malachy Moan M.D.   On: 07/29/2017 16:45    ____________________________________________   PROCEDURES  Critical Care performed: Yes, see critical care procedure note(s)   Procedure(s) performed:   .Critical Care Performed by: Loleta Rose, MD Authorized by: Loleta Rose, MD   Critical care provider statement:    Critical care time (minutes):  30   Critical care time was exclusive of:  Separately billable procedures and treating other patients   Critical care was necessary to treat or prevent imminent or life-threatening deterioration of the following conditions:  Respiratory failure   Critical care was time spent personally by me on the following activities:  Development of  treatment plan with patient or surrogate, discussions with consultants, evaluation of patient's response to treatment, examination of patient, obtaining history from patient or surrogate, ordering and performing treatments and interventions, ordering and review of laboratory studies, ordering and review of radiographic studies, pulse oximetry, re-evaluation of patient's condition and review of old charts     ____________________________________________   INITIAL IMPRESSION / ASSESSMENT AND PLAN / ED COURSE  As part of my medical decision making, I reviewed the following data within the electronic MEDICAL RECORD NUMBER Nursing notes reviewed and incorporated, Labs reviewed , EKG interpreted , Old chart reviewed, Radiograph reviewed , Discussed with admitting physician (Dr. Elpidio Anis) and Notes from prior ED  visits  Differential diagnosis includes, but is not limited to, acute CHF exacerbation, ACS, PE, alcohol withdrawal.  I believe that the patient suffered from an acute CHF exacerbation and alcohol withdrawal.  I am giving Ativan 2 mg IV for her anxiety and further withdrawal symptoms.  Her chest x-ray is consistent with CHF as is the weight gain she does experience over the last week.  She is going to be a difficult patient to treat because she also appears somewhat dehydrated and she will need careful monitoring of her ins and outs.  I explained all this to the patient and her husband and she understands and agrees with the plan for inpatient treatment for CHF.  I am giving thiamine 100 mg IV, folic acid 1 mg IV, Lasix 20 mg IV to begin diuresis, and Ativan 2 mg IV as described above.  I will contact the hospitalist for admission.  Patient and family agrees with the plan.  Clinical Course as of Jul 30 1842  Mon Jul 29, 2017  1824 Spoke with Dr. Elpidio Anis with the hospitalist service who will admit.   [CF]    Clinical Course User Index [CF] Loleta Rose, MD     ____________________________________________  FINAL CLINICAL IMPRESSION(S) / ED DIAGNOSES  Final diagnoses:  Acute on chronic congestive heart failure, unspecified heart failure type (HCC)  Alcohol withdrawal syndrome with complication (HCC)  Anxiety     MEDICATIONS GIVEN DURING THIS VISIT:  Medications  folic acid 1 mg in sodium chloride 0.9 % 50 mL IVPB (has no administration in time range)  LORazepam (ATIVAN) injection 2 mg (2 mg Intravenous Given 07/29/17 1829)  furosemide (LASIX) injection 20 mg (20 mg Intravenous Given 07/29/17 1829)  thiamine (B-1) injection 100 mg (100 mg Intravenous Given 07/29/17 1829)     ED Discharge Orders    None       Note:  This document was prepared using Dragon voice recognition software and may include unintentional dictation errors.    Loleta Rose, MD 07/29/17 [phone]

## 2017-07-29 NOTE — ED Triage Notes (Signed)
States short of since she woke up this am. States this is not usual for her. Hx of CHF. Has not noticed fluid retention.

## 2017-07-29 NOTE — Progress Notes (Signed)
eLink Physician-Brief Progress Note Patient Name: Joanna Sanchez DOB: 10-28-1965 MRN: [id number]   Date of Service  07/29/2017  HPI/Events of Note  52 yo female admitted with ETOH withdrawal and acute on chronic hypoxic respiratory failure secondary to CHF exacerbation. Already seen in consultation by PCCM. VSS.   eICU Interventions  No new orders.      Intervention Category Evaluation Type: New Patient Evaluation  Lenell Antu 07/29/2017, 10:40 PM

## 2017-07-29 NOTE — H&P (Signed)
[facility] - [place] at [facility]   PATIENT NAME: Joanna Sanchez    MR#:  [id number]  DATE OF BIRTH:  16-Jan-1966  DATE OF ADMISSION:  07/29/2017  PRIMARY CARE PHYSICIAN: [facility], [place] [facility]   REQUESTING/REFERRING PHYSICIAN: Dr. York Cerise  CHIEF COMPLAINT:   Chief Complaint  Patient presents with  . Shortness of Breath    HISTORY OF PRESENT ILLNESS:  Joanna Sanchez  is a 52 y.o. female with a known history of hypertension, alcoholic liver disease, chronic diastolic CHF, alcohol abuse, neuropathy presents to the emergency room due to worsening shortness of breath since yesterday.  Patient drinks one large bottle of liquor daily.  She has felt increasing shortness of breath, orthopnea and lower examinee swelling since yesterday.  Here in the emergency room patient is extremely tachypneic, tachycardic.  She has not had any alcohol in 24 hours.  Needing 3 L oxygen.  Chest x-ray shows significant pulmonary edema and bilateral pleural effusions. Patient is being admitted for acute on chronic diastolic congestive heart failure and alcohol withdrawal.  Patient had a long stay at [facility] in September 2018 when she was on hemodialysis and had congestive heart failure.  Later her renal function returned to normal.  She was also intubated at that time.  PAST MEDICAL HISTORY:   Past Medical History:  Diagnosis Date  . Alcohol abuse   . CHF (congestive heart failure) (HCC)   . Liver disease   . Neuropathy     PAST SURGICAL HISTORY:   Past Surgical History:  Procedure Laterality Date  . ABDOMINAL HYSTERECTOMY    . BREAST LUMPECTOMY Right   . TONSILLECTOMY      SOCIAL HISTORY:   Social History   Tobacco Use  . Smoking status: Never Smoker  . Smokeless tobacco: Never Used  Substance Use Topics  . Alcohol use: Yes    FAMILY HISTORY:   Family History  Problem Relation Age of Onset  . Diabetes Father     DRUG ALLERGIES:   Allergies  Allergen Reactions   . Eliquis [Apixaban] Anaphylaxis  . Niacin And Related   . Other     Patient states that she had an anaphylactic reaction to a blood thinner but does not remember the name of it     REVIEW OF SYSTEMS:   Review of Systems  Constitutional: Positive for diaphoresis and malaise/fatigue. Negative for chills and fever.  HENT: Negative for sore throat.   Eyes: Negative for blurred vision, double vision and pain.  Respiratory: Positive for cough, shortness of breath and wheezing. Negative for hemoptysis.   Cardiovascular: Positive for orthopnea and leg swelling. Negative for chest pain and palpitations.  Gastrointestinal: Negative for abdominal pain, constipation, diarrhea, heartburn, nausea and vomiting.  Genitourinary: Negative for dysuria and hematuria.  Musculoskeletal: Negative for back pain and joint pain.  Skin: Negative for rash.  Neurological: Negative for sensory change, speech change, focal weakness and headaches.  Endo/Heme/Allergies: Does not bruise/bleed easily.  Psychiatric/Behavioral: Positive for depression and memory loss. The patient is nervous/anxious.     MEDICATIONS AT HOME:   Prior to Admission medications   Medication Sig Start Date End Date Taking? Authorizing Provider  carvedilol (COREG) 12.5 MG tablet Take 12.5 mg by mouth 2 (two) times daily with a meal.    [provider]  DULoxetine (CYMBALTA) 60 MG capsule Take 60 mg by mouth 2 (two) times daily.    [provider]  famotidine (PEPCID) 20 MG tablet Take 20 mg  by mouth 2 (two) times daily as needed for heartburn or indigestion.    [provider]  gabapentin (NEURONTIN) 300 MG capsule Take 300 mg by mouth 3 (three) times daily.    [provider]  loperamide (IMODIUM) 2 MG capsule Take 2 mg by mouth 3 (three) times daily as needed for diarrhea or loose stools.    [provider]  loratadine (CLARITIN) 10 MG tablet Take 10 mg by mouth daily.    [provider]  Multiple Vitamin (MULTIVITAMIN) capsule Take 1 capsule by mouth daily.    [provider]  ondansetron (ZOFRAN) 4 MG tablet Take 4 mg by mouth every 8 (eight) hours as needed for nausea or vomiting.    [provider]  oxyCODONE (OXY IR/ROXICODONE) 5 MG immediate release tablet Take 5 mg by mouth 2 (two) times daily.    [provider]  QUEtiapine (SEROQUEL) 100 MG tablet Take 100 mg by mouth at bedtime.    [provider]  rifaximin (XIFAXAN) 550 MG TABS tablet Take 550 mg by mouth 2 (two) times daily.    [provider]  rivaroxaban (XARELTO) 20 MG TABS tablet Take 20 mg by mouth every morning.    [provider]  sevelamer carbonate (RENVELA) 800 MG tablet Take 800 mg by mouth 3 (three) times daily with meals.    [provider]     VITAL SIGNS:  Blood pressure (!) 159/127, pulse (!) 133, temperature 98.6 F (37 C), temperature source Oral, resp. rate (!) 30, height  (1.626 m), weight 49.9 kg (110 lb), SpO2 98 %.  PHYSICAL EXAMINATION:  Physical Exam  GENERAL:  52 y.o.-year-old patient lying in the bed .  In respiratory distress.  Able to speak only 1-2 words. EYES: Pupils equal, round, reactive to light and accommodation. No scleral icterus. Extraocular muscles intact.  HEENT: Head atraumatic, normocephalic. Oropharynx and nasopharynx clear. No oropharyngeal erythema, moist oral mucosa  NECK:  Supple, no jugular venous distention. No thyroid enlargement, no tenderness.  LUNGS: Increased work of breathing.  Decreased air entry bilaterally with wheezing and crackles CARDIOVASCULAR: S1, S2 normal. No murmurs, rubs, or gallops.  ABDOMEN: Soft, nontender, nondistended. Bowel sounds present. No organomegaly or mass.  EXTREMITIES: No cyanosis, or clubbing. + 2 pedal & radial pulses b/l.  Bilateral lower extremity edema NEUROLOGIC: Cranial nerves II through XII are intact. No focal Motor or sensory deficits appreciated  b/l Tremors PSYCHIATRIC: The patient is alert and awake.  Anxious. SKIN: No obvious rash, lesion, or ulcer.   LABORATORY PANEL:   CBC Recent Labs  Lab 07/29/17 1628  WBC 5.7  HGB 10.6*  HCT 33.0*  PLT 215   ------------------------------------------------------------------------------------------------------------------  Chemistries  Recent Labs  Lab 07/29/17 1628  NA 141  K 4.3  CL 103  CO2 27  GLUCOSE 120*  BUN 12  CREATININE 0.48  CALCIUM 8.1*  AST 198*  ALT 64*  ALKPHOS 480*  BILITOT 0.9   ------------------------------------------------------------------------------------------------------------------  Cardiac Enzymes Recent Labs  Lab 07/29/17 1628  TROPONINI <0.03   ------------------------------------------------------------------------------------------------------------------  RADIOLOGY:  Dg Chest 2 View  Result Date: 07/29/2017 CLINICAL DATA:  52 year old female with shortness of breath and history of congestive heart failure EXAM: CHEST - 2 VIEW COMPARISON:  Prior chest x-ray 04/07/2013 FINDINGS: Cardiomegaly. Bilateral interstitial airspace opacities in a predominantly perihilar distribution. Additionally, there appear to be small bilateral layering pleural effusions and associated bibasilar airspace opacities. Of note, the left perihilar soft tissues are slightly  more full than the right. No pneumothorax. No acute osseous abnormality. IMPRESSION: 1. Findings are most suggestive of mild CHF with cardiomegaly, mild interstitial edema, small bilateral pleural effusions and bibasilar atelectasis. 2. However, the left hilar soft tissues are slightly more prominent than the right. While this may simply be due to atelectasis superimposed on the underlying pulmonary edema, a follow-up PA and lateral chest x-ray is recommended once the patient's CHF has been treated to exclude the possibility of an underlying hilar mass. Electronically Signed   By: Malachy Moan  M.D.   On: 07/29/2017 16:45   IMPRESSION AND PLAN:   *Acute on chronic diastolic congestive heart failure with acute hypoxic respiratory failure -We will give stat dose of IV Lasix 60 mg  Scheduled IV Lasix, Beta blockers - Input and Output - Counseled to limit fluids and Salt - Monitor Bun/Cr and Potassium - Echo-ordered -Cardiology follow up after discharge We will place Foley catheter due to being critically ill and with diuresis.  *Alcohol withdrawals.  Start CIWA protocol,. We will add scheduled Librium Counseled to quit alcohol  *Anemia of chronic disease.  Stable.  DVT prophylaxis with Lovenox  Discussed with Dr. Lonn Georgia in the ICU.  Consult placed.  Discussed with patient and husband in detail.  Patient is critically ill with high risk for deterioration and death.  Will be admitted to ICU.  High risk for intubation.  CODE STATUS: Full code  TOTAL CRITICAL CARE TIME TAKING CARE OF THIS PATIENT: 80 minutes.   Molinda Bailiff Mantaj Chamberlin M.D on 07/29/2017 at 6:58 PM  Between 7am to 6pm - Pager - [phone]  After 6pm go to [email or web address] - password [email or web address]  [facility]  Office  [phone]  CC: Primary care physician; [facility] [place] [facility]  Note: This dictation was prepared with Dragon dictation along with smaller phrase technology. Any transcriptional errors that result from this process are unintentional.

## 2017-07-30 ENCOUNTER — Inpatient Hospital Stay (HOSPITAL_COMMUNITY)
Admit: 2017-07-30 | Discharge: 2017-07-30 | Disposition: A | Discharge status: Home / Self Care | Attending: Internal Medicine | Admitting: Internal Medicine

## 2017-07-30 DIAGNOSIS — R0600 Dyspnea, unspecified: Secondary | ICD-10-CM

## 2017-07-30 DIAGNOSIS — I340 Nonrheumatic mitral (valve) insufficiency: Secondary | ICD-10-CM

## 2017-07-30 DIAGNOSIS — I361 Nonrheumatic tricuspid (valve) insufficiency: Secondary | ICD-10-CM

## 2017-07-30 LAB — COMPREHENSIVE METABOLIC PANEL
ALT: 54 U/L (ref 14–54)
AST: 150 U/L — AB (ref 15–41)
Albumin: 3.3 g/dL — ABNORMAL LOW (ref 3.5–5.0)
Alkaline Phosphatase: 467 U/L — ABNORMAL HIGH (ref 38–126)
Anion gap: 9 (ref 5–15)
BUN: 13 mg/dL (ref 6–20)
CO2: 33 mmol/L — ABNORMAL HIGH (ref 22–32)
Calcium: 7.6 mg/dL — ABNORMAL LOW (ref 8.9–10.3)
Chloride: 99 mmol/L — ABNORMAL LOW (ref 101–111)
Creatinine, Ser: 0.7 mg/dL (ref 0.44–1.00)
Glucose, Bld: 78 mg/dL (ref 65–99)
POTASSIUM: 3.6 mmol/L (ref 3.5–5.1)
Sodium: 141 mmol/L (ref 135–145)
Total Bilirubin: 1 mg/dL (ref 0.3–1.2)
Total Protein: 6.3 g/dL — ABNORMAL LOW (ref 6.5–8.1)

## 2017-07-30 LAB — CBC
HEMATOCRIT: 30.1 % — AB (ref 35.0–47.0)
HEMOGLOBIN: 9.6 g/dL — AB (ref 12.0–16.0)
MCH: 29.6 pg (ref 26.0–34.0)
MCHC: 31.8 g/dL — ABNORMAL LOW (ref 32.0–36.0)
MCV: 93 fL (ref 80.0–100.0)
Platelets: 127 10*3/uL — ABNORMAL LOW (ref 150–440)
RBC: 3.24 MIL/uL — AB (ref 3.80–5.20)
RDW: 15.7 % — ABNORMAL HIGH (ref 11.5–14.5)
WBC: 6.1 10*3/uL (ref 3.6–11.0)

## 2017-07-30 LAB — MAGNESIUM: Magnesium: 1.4 mg/dL — ABNORMAL LOW (ref 1.7–2.4)

## 2017-07-30 LAB — ECHOCARDIOGRAM COMPLETE
Height: 64 in
Weight: 1929.47 oz

## 2017-07-30 MED ORDER — POTASSIUM CHLORIDE 20 MEQ PO PACK: 40.0000 meq | PACK | Freq: Once | ORAL | Status: DC

## 2017-07-30 MED ORDER — POTASSIUM CHLORIDE CRYS ER 20 MEQ PO TBCR
40.0000 meq | EXTENDED_RELEASE_TABLET | Freq: Once | ORAL | Status: AC
  Administered 2017-07-30: 40 meq via ORAL
  Filled 2017-07-30: qty 2

## 2017-07-30 MED ORDER — MAGNESIUM SULFATE 4 GM/100ML IV SOLN
4.0000 g | Freq: Once | INTRAVENOUS | Status: AC
  Administered 2017-07-30: 4 g via INTRAVENOUS
  Filled 2017-07-30: qty 100

## 2017-07-30 MED ORDER — MAGNESIUM OXIDE 400 (241.3 MG) MG PO TABS
400.0000 mg | ORAL_TABLET | Freq: Two times a day (BID) | ORAL | Status: DC
  Administered 2017-07-30 – 2017-08-03 (×8): 400 mg via ORAL
  Filled 2017-07-30 (×8): qty 1

## 2017-07-30 NOTE — Progress Notes (Signed)
[facility] - [place] at [facility]   PATIENT NAME: Joanna Sanchez    MR#:  [id number]  DATE OF BIRTH:  1966-01-23  SUBJECTIVE:  Patient seen and evaluated today Has decreased shortness of breath Periods of agitation No chest pain  REVIEW OF SYSTEMS:    ROS  CONSTITUTIONAL: No documented fever. No fatigue, weakness. No weight gain, no weight loss.  EYES: No blurry or double vision.  ENT: No tinnitus. No postnasal drip. No redness of the oropharynx.  RESPIRATORY: No cough, no wheeze, no hemoptysis. Has decreased dyspnea.  CARDIOVASCULAR: No chest pain. No orthopnea. No palpitations. No syncope.  GASTROINTESTINAL: No nausea, no vomiting or diarrhea. No abdominal pain. No melena or hematochezia.  GENITOURINARY: No dysuria or hematuria.  ENDOCRINE: No polyuria or nocturia. No heat or cold intolerance.  HEMATOLOGY: No anemia. No bruising. No bleeding.  INTEGUMENTARY: No rashes. No lesions.  MUSCULOSKELETAL: No arthritis. No swelling. No gout.  NEUROLOGIC: No numbness, tingling, or ataxia. No seizure-type activity.  PSYCHIATRIC: No anxiety. No insomnia. No ADD.   DRUG ALLERGIES:   Allergies  Allergen Reactions  . Eliquis [Apixaban] Anaphylaxis  . Niacin And Related   . Other     Patient states that she had an anaphylactic reaction to a blood thinner but does not remember the name of it     VITALS:  Blood pressure (!) 134/98, pulse 91, temperature 98.1 F (36.7 C), resp. rate 15, height  (1.626 m), weight 54.7 kg (120 lb 9.5 oz), SpO2 99 %.  PHYSICAL EXAMINATION:   Physical Exam  GENERAL:  52 y.o.-year-old patient lying in the bed with no acute distress.  EYES: Pupils equal, round, reactive to light and accommodation. No scleral icterus. Extraocular muscles intact.  HEENT: Head atraumatic, normocephalic. Oropharynx and nasopharynx clear.  NECK:  Supple, no jugular venous distention. No thyroid enlargement, no tenderness.  LUNGS: Improved breath sounds  bilaterally, bibasilar rales heard. No use of accessory muscles of respiration.  CARDIOVASCULAR: S1, S2 normal. No murmurs, rubs, or gallops.  ABDOMEN: Soft, nontender, nondistended. Bowel sounds present. No organomegaly or mass.  EXTREMITIES: No cyanosis, clubbing or edema b/l.    NEUROLOGIC: Cranial nerves II through XII are intact. No focal Motor or sensory deficits b/l.   PSYCHIATRIC: The patient is alert and oriented x 3.  SKIN: No obvious rash, lesion, or ulcer.   LABORATORY PANEL:   CBC Recent Labs  Lab 07/30/17 0621  WBC 6.1  HGB 9.6*  HCT 30.1*  PLT 127*   ------------------------------------------------------------------------------------------------------------------ Chemistries  Recent Labs  Lab 07/30/17 0621  NA 141  K 3.6  CL 99*  CO2 33*  GLUCOSE 78  BUN 13  CREATININE 0.70  CALCIUM 7.6*  MG 1.4*  AST 150*  ALT 54  ALKPHOS 467*  BILITOT 1.0   ------------------------------------------------------------------------------------------------------------------  Cardiac Enzymes Recent Labs  Lab 07/29/17 1628  TROPONINI <0.03   ------------------------------------------------------------------------------------------------------------------  RADIOLOGY:  Dg Chest 2 View  Result Date: 07/29/2017 CLINICAL DATA:  52 year old female with shortness of breath and history of congestive heart failure EXAM: CHEST - 2 VIEW COMPARISON:  Prior chest x-ray 04/07/2013 FINDINGS: Cardiomegaly. Bilateral interstitial airspace opacities in a predominantly perihilar distribution. Additionally, there appear to be small bilateral layering pleural effusions and associated bibasilar airspace opacities. Of note, the left perihilar soft tissues are slightly more full than the right. No pneumothorax. No acute osseous abnormality. IMPRESSION: 1. Findings are most suggestive of mild CHF with cardiomegaly, mild interstitial edema, small bilateral pleural effusions  and bibasilar  atelectasis. 2. However, the left hilar soft tissues are slightly more prominent than the right. While this may simply be due to atelectasis superimposed on the underlying pulmonary edema, a follow-up PA and lateral chest x-ray is recommended once the patient's CHF has been treated to exclude the possibility of an underlying hilar mass. Electronically Signed   By: Malachy Moan M.D.   On: 07/29/2017 16:45     ASSESSMENT AND PLAN:   52 year old female patient with history of hypertension, alcoholic liver disease, diastolic heart failure, alcohol abuse, neuropathy currently in stepdown unit for respiratory distress.  -Acute on chronic diastolic heart failure exacerbation IV Lasix for diuresis Input output chart Fluid restriction Monitor electrolytes Follow-up echocardiogram  -Acute respiratory distress with hypoxia Off BiPAP On oxygen via nasal cannula  -Alcohol withdrawal On CIWA protocol Scheduled Librium Substance abuse counseling given  -DVT prophylaxis subcu Lovenox  -Anemia of chronic disease  All the records are reviewed and case discussed with Care Management/Social Worker. Management plans discussed with the patient, family and they are in agreement.  CODE STATUS: Full Code  DVT Prophylaxis: SCDs  TOTAL TIME TAKING CARE OF THIS PATIENT: 35 minutes.   POSSIBLE D/C IN 3 to 4 DAYS, DEPENDING ON CLINICAL CONDITION.  Ihor Austin M.D on 07/30/2017 at 3:38 PM  Between 7am to 6pm - Pager - [phone]  After 6pm go to [email or web address] - password [email or web address]  [facility] [place] [facility]  Office  [phone]  CC: Primary care physician; [facility] [facility]  Note: This dictation was prepared with Dragon dictation along with smaller phrase technology. Any transcriptional errors that result from this process are unintentional.

## 2017-07-30 NOTE — Progress Notes (Signed)
Patient transferred to 1-A as ordered.  Report called to Aggie Cosier, Charity fundraiser.  Patient left floor on telemetry via bed with all personal belongings and sitter.

## 2017-07-30 NOTE — Progress Notes (Signed)
Pharmacy Electrolyte Monitoring Consult:  Pharmacy consulted to assist in monitoring and replacing electrolytes in this 52 y.o. female admitted on 07/29/2017 with CHF exacerbation and alcohol withdrawal.   Patient receiving furosemide  IV BID.   Labs:  Sodium (mmol/L)  Date Value  07/30/2017 141  04/07/2013 134 (L)   Potassium (mmol/L)  Date Value  07/30/2017 3.6  04/07/2013 3.3 (L)   Magnesium (mg/dL)  Date Value  16/12/9602 1.4 (L)   Calcium (mg/dL)  Date Value  54/11/8117 7.6 (L)   Calcium, Total (mg/dL)  Date Value  14/78/2956 8.7   Albumin (g/dL)  Date Value  21/30/8657 3.3 (L)  04/07/2013 2.9 (L)    Plan:  Will order potassium PO x 1, magnesium 4g IV BID and initiate magnesium oxide  BID. Will recheck electrolytes with am labs. Will need to replace electrolytes orally when possible in the setting of CHF exacerbation requiring IV furosemide.   Pharmacy will continue to monitor and adjust per consult.   Simpson,Michael L 07/30/2017 5:18 PM

## 2017-07-30 NOTE — Progress Notes (Signed)
0750 patient agitated and unable to follow commands Requesting Ativan. Tremors and very agitated. Talks to self. Inability to concentrate. Attempting to get up.Ask to stay in bed and not get up by herself. Call bell demonstrated.1610 Found patient standing beside of bed. Incontinent of stool. Ask to call not fall.0830 Patient found out of bed. Unable to comprehend need to stay in bed. Dr.Conforti ordered Recruitment consultant. 1000 patient request pain and nausea medication. Given as well as antidiarrhea medication.

## 2017-07-30 NOTE — Progress Notes (Signed)
0800 Very nervious and figity. Request Ativan for nervousness Given prn

## 2017-07-30 NOTE — Progress Notes (Signed)
*  PRELIMINARY RESULTS* Echocardiogram 2D Echocardiogram has been performed.  Cristela Blue 07/30/2017, 10:50 AM

## 2017-07-31 LAB — BASIC METABOLIC PANEL
Anion gap: 9 (ref 5–15)
BUN: 11 mg/dL (ref 6–20)
CHLORIDE: 94 mmol/L — AB (ref 101–111)
CO2: 36 mmol/L — ABNORMAL HIGH (ref 22–32)
Calcium: 7.6 mg/dL — ABNORMAL LOW (ref 8.9–10.3)
Creatinine, Ser: 0.68 mg/dL (ref 0.44–1.00)
GFR calc Af Amer: >60 mL/min (ref >60)
GFR calc non Af Amer: >60 mL/min (ref >60)
Glucose, Bld: 80 mg/dL (ref 65–99)
POTASSIUM: 3.2 mmol/L — AB (ref 3.5–5.1)
SODIUM: 139 mmol/L (ref 135–145)

## 2017-07-31 LAB — HIV ANTIBODY (ROUTINE TESTING W REFLEX): HIV SCREEN 4TH GENERATION: NONREACTIVE

## 2017-07-31 LAB — MAGNESIUM: MAGNESIUM: 1.8 mg/dL (ref 1.7–2.4)

## 2017-07-31 MED ORDER — POTASSIUM CHLORIDE CRYS ER 20 MEQ PO TBCR
40.0000 meq | EXTENDED_RELEASE_TABLET | Freq: Once | ORAL | Status: AC
  Administered 2017-07-31: 40 meq via ORAL
  Filled 2017-07-31: qty 2

## 2017-07-31 MED ORDER — FUROSEMIDE 10 MG/ML IJ SOLN
40.0000 mg | Freq: Every day | INTRAMUSCULAR | Status: DC
  Administered 2017-07-31: 40 mg via INTRAVENOUS
  Filled 2017-07-31: qty 4

## 2017-07-31 MED ORDER — IPRATROPIUM-ALBUTEROL 0.5-2.5 (3) MG/3ML IN SOLN: 3.0000 mL | Freq: Four times a day (QID) | RESPIRATORY_TRACT | Status: DC | PRN

## 2017-07-31 NOTE — Progress Notes (Signed)
Chaplain was referred by nurse. Pt was wanting to be discharged. Pt expressed disappointed by not being discharged today. She talked about her family. She show a picture  of her and her son and attempted to find a picture of her dog (Ziggy). She was unable to locate it in her phone. Chaplain practiced pastoral presence and active listening as she did family review regarding her mother and father death and the life she and her sister had to live was a result. There seem to be a dependency on alcohol which she said helps relieve  the pain of her hurt.     07/31/17 1300  Clinical Encounter Type  Visited With Patient  Visit Type Spiritual support  Referral From Chaplain  Spiritual Encounters  Spiritual Needs Prayer;Emotional

## 2017-07-31 NOTE — Progress Notes (Signed)
[facility] - [place] at [facility]   PATIENT NAME: Joanna Sanchez    MR#:  [id number]  DATE OF BIRTH:  Dec 11, 1965  SUBJECTIVE:  Patient seen and evaluated today Oxygen by nasal cannula Gets short of breath on ambulation  She is actively withdrawing  REVIEW OF SYSTEMS:    ROS  CONSTITUTIONAL: No documented fever. No fatigue, weakness. No weight gain, no weight loss.  EYES: No blurry or double vision.  ENT: No tinnitus. No postnasal drip. No redness of the oropharynx.  RESPIRATORY: No cough, no wheeze, no hemoptysis. Has  dyspnea.  CARDIOVASCULAR: No chest pain. No orthopnea. No palpitations. No syncope.  GASTROINTESTINAL: No nausea, no vomiting or diarrhea. No abdominal pain. No melena or hematochezia.  GENITOURINARY: No dysuria or hematuria.  ENDOCRINE: No polyuria or nocturia. No heat or cold intolerance.  HEMATOLOGY: No anemia. No bruising. No bleeding.  INTEGUMENTARY: No rashes. No lesions.  MUSCULOSKELETAL: No arthritis. No swelling. No gout.  NEUROLOGIC: No numbness, tingling, or ataxia. No seizure-type activity.  Has tremors upper extremities PSYCHIATRIC: No anxiety. No insomnia. No ADD.   DRUG ALLERGIES:   Allergies  Allergen Reactions  . Eliquis [Apixaban] Anaphylaxis  . Niacin And Related   . Other     Patient states that she had an anaphylactic reaction to a blood thinner but does not remember the name of it     VITALS:  Blood pressure 124/72, pulse 98, temperature 98.3 F (36.8 C), temperature source Oral, resp. rate 17, height  (1.626 m), weight 50.6 kg (111 lb 8.8 oz), SpO2 (!) 82 %.  PHYSICAL EXAMINATION:   Physical Exam  GENERAL:  52 y.o.-year-old patient lying in the bed  EYES: Pupils equal, round, reactive to light and accommodation. No scleral icterus. Extraocular muscles intact.  HEENT: Head atraumatic, normocephalic. Oropharynx and nasopharynx clear.  NECK:  Supple, no jugular venous distention. No thyroid enlargement, no  tenderness.  LUNGS: Improved breath sounds bilaterally, bibasilar rales heard. No use of accessory muscles of respiration.  CARDIOVASCULAR: S1, S2 normal. No murmurs, rubs, or gallops.  ABDOMEN: Soft, nontender, nondistended. Bowel sounds present. No organomegaly or mass.  EXTREMITIES: No cyanosis, clubbing or edema b/l.    Tremors upper extremities NEUROLOGIC: Cranial nerves II through XII are intact. No focal Motor or sensory deficits b/l.   PSYCHIATRIC: The patient is alert and oriented x 2 SKIN: No obvious rash, lesion, or ulcer.   LABORATORY PANEL:   CBC Recent Labs  Lab 07/30/17 0621  WBC 6.1  HGB 9.6*  HCT 30.1*  PLT 127*   ------------------------------------------------------------------------------------------------------------------ Chemistries  Recent Labs  Lab 07/30/17 0621 07/31/17 0440  NA 141 139  K 3.6 3.2*  CL 99* 94*  CO2 33* 36*  GLUCOSE 78 80  BUN 13 11  CREATININE 0.70 0.68  CALCIUM 7.6* 7.6*  MG 1.4* 1.8  AST 150*  --   ALT 54  --   ALKPHOS 467*  --   BILITOT 1.0  --    ------------------------------------------------------------------------------------------------------------------  Cardiac Enzymes Recent Labs  Lab 07/29/17 1628  TROPONINI <0.03   ------------------------------------------------------------------------------------------------------------------  RADIOLOGY:  Dg Chest 2 View  Result Date: 07/29/2017 CLINICAL DATA:  52 year old female with shortness of breath and history of congestive heart failure EXAM: CHEST - 2 VIEW COMPARISON:  Prior chest x-ray 04/07/2013 FINDINGS: Cardiomegaly. Bilateral interstitial airspace opacities in a predominantly perihilar distribution. Additionally, there appear to be small bilateral layering pleural effusions and associated bibasilar airspace opacities. Of note, the left perihilar soft  tissues are slightly more full than the right. No pneumothorax. No acute osseous abnormality. IMPRESSION: 1.  Findings are most suggestive of mild CHF with cardiomegaly, mild interstitial edema, small bilateral pleural effusions and bibasilar atelectasis. 2. However, the left hilar soft tissues are slightly more prominent than the right. While this may simply be due to atelectasis superimposed on the underlying pulmonary edema, a follow-up PA and lateral chest x-ray is recommended once the patient's CHF has been treated to exclude the possibility of an underlying hilar mass. Electronically Signed   By: Malachy Moan M.D.   On: 07/29/2017 16:45     ASSESSMENT AND PLAN:   52 year old female patient with history of hypertension, alcoholic liver disease, diastolic heart failure, alcohol abuse, neuropathy currently in stepdown unit for respiratory distress.  -Acute on chronic combined systolic and diastolic heart failure exacerbation improving slowly IV Lasix for diuresis Input output chart Fluid restriction Monitor electrolytes Echocardiogram - Left ventricle: The cavity size was at the upper limits of   normal. Wall thickness was normal. Systolic function was severely   reduced. The estimated ejection fraction was in the range of 20%   to 25%. Diffuse hypokinesis. Regional wall motion abnormalities   cannot be excluded. The study is not technically sufficient to   allow evaluation of LV diastolic function. - Mitral valve: Mildly thickened leaflets . There was moderate   regurgitation. - Right ventricle: The cavity size was mildly dilated. Wall   thickness was normal. Systolic function was mildly reduced. - Right atrium: The atrium was mildly dilated. - Tricuspid valve: There was malcoaptation of the valve leaflets.   There was severe regurgitation. - Pulmonary arteries: Systolic pressure was moderately increased,   in the range of 50 mm Hg to 55 mm Hg. - Pericardium, extracardiac: A small to moderate pericardial   effusion was identified posterior to the heart. There was a left   pleural  effusion.   -Acute respiratory distress with hypoxia Off BiPAP On oxygen via nasal cannula Will try to wean oxygen  - Alcoholic cardiomyopathy Alcohol abstinence   -Active alcohol withdrawal On CIWA protocol Scheduled Librium Substance abuse counseling given  -Patient involuntarily committed as she is not completely oriented and adamant to go home She is unstable on her feet and high risk of fall.  Danger to self secondary to gait instability Continue Alcohol detox protocol  -DVT prophylaxis subcu Lovenox  -Anemia of chronic disease  All the records are reviewed and case discussed with Care Management/Social Worker. Management plans discussed with the patient, family and they are in agreement.  CODE STATUS: Full Code  DVT Prophylaxis: SCDs  TOTAL TIME TAKING CARE OF THIS PATIENT: 35 minutes.   POSSIBLE D/C IN 3 to 4 DAYS, DEPENDING ON CLINICAL CONDITION.  Ihor Austin M.D on 07/31/2017 at 2:20 PM  Between 7am to 6pm - Pager - [phone]  After 6pm go to [email or web address] - password [email or web address]  [facility] [place] [facility]  Office  [phone]  CC: Primary care physician; Care, [place] [facility]  Note: This dictation was prepared with Dragon dictation along with smaller phrase technology. Any transcriptional errors that result from this process are unintentional.

## 2017-07-31 NOTE — Progress Notes (Signed)
Patient's tele sitter alarm went off and this RN was instructed that patient took meds from purse from an orange bottle.  This RN asked if patient had medication bottles in purse.  Patient first pulled out a white bottle marked generic tylenol. The Tele sitter stated that patient took medications from an orange bottle.  This RN inquired if the patient had anymore medication in her purse.  Than patient handed over an orange colored bottle labeled Clonazepam 0.5mg  tabs.  The tele sitter was unable to see how many tabs patient took. Patient had just received Ativan  IV at 1329. Notified Dr. Tobi Bastos and Shanda Bumps, RN.  No new orders at this time.
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
## 2017-07-31 NOTE — Progress Notes (Signed)
Pharmacy Electrolyte Monitoring Consult:  Pharmacy consulted to assist in monitoring and replacing electrolytes in this 52 y.o. female admitted on 07/29/2017 with CHF exacerbation and alcohol withdrawal.   Patient receiving furosemide  IV BID.   Labs:  Sodium (mmol/L)  Date Value  07/31/2017 139  04/07/2013 134 (L)   Potassium (mmol/L)  Date Value  07/31/2017 3.2 (L)  04/07/2013 3.3 (L)   Magnesium (mg/dL)  Date Value  16/12/9602 1.8   Calcium (mg/dL)  Date Value  54/11/8117 7.6 (L)   Calcium, Total (mg/dL)  Date Value  14/78/2956 8.7   Albumin (g/dL)  Date Value  21/30/8657 3.3 (L)  04/07/2013 2.9 (L)    Plan:  K: 3.2 and Mg: 1.8. KCL PO x 1 and MagOx , magnesium  BID ordered.    No additional replacement needed at this time.  Will need to continue replace electrolytes orally when possible in the setting of CHF exacerbation requiring IV furosemide.   Pharmacy will continue to monitor and adjust per consult.   Gardner Candle, PharmD, BCPS Clinical Pharmacist 07/31/2017 8:49 AM

## 2017-07-31 NOTE — Progress Notes (Signed)
IVC paperwork taken to ED, notarized, faxed to magistrate.

## 2017-08-01 DIAGNOSIS — K7460 Unspecified cirrhosis of liver: Secondary | ICD-10-CM

## 2017-08-01 DIAGNOSIS — F10931 Alcohol use, unspecified with withdrawal delirium: Secondary | ICD-10-CM

## 2017-08-01 DIAGNOSIS — F10231 Alcohol dependence with withdrawal delirium: Principal | ICD-10-CM

## 2017-08-01 DIAGNOSIS — F1010 Alcohol abuse, uncomplicated: Secondary | ICD-10-CM

## 2017-08-01 LAB — BASIC METABOLIC PANEL
Anion gap: 13 (ref 5–15)
BUN: 11 mg/dL (ref 6–20)
CALCIUM: 7.8 mg/dL — AB (ref 8.9–10.3)
CO2: 31 mmol/L (ref 22–32)
Chloride: 94 mmol/L — ABNORMAL LOW (ref 101–111)
Creatinine, Ser: 0.71 mg/dL (ref 0.44–1.00)
GFR calc Af Amer: >60 mL/min (ref >60)
Glucose, Bld: 83 mg/dL (ref 65–99)
Potassium: 4 mmol/L (ref 3.5–5.1)
SODIUM: 138 mmol/L (ref 135–145)

## 2017-08-01 LAB — URINALYSIS, COMPLETE (UACMP) WITH MICROSCOPIC
BILIRUBIN URINE: NEGATIVE
Glucose, UA: NEGATIVE mg/dL
Hgb urine dipstick: NEGATIVE
KETONES UR: NEGATIVE mg/dL
Nitrite: POSITIVE — AB
PROTEIN: NEGATIVE mg/dL
SPECIFIC GRAVITY, URINE: 1.014 (ref 1.005–1.030)
pH: 6 (ref 5.0–8.0)

## 2017-08-01 LAB — AMMONIA: AMMONIA: 42 umol/L — AB (ref 9–35)

## 2017-08-01 MED ORDER — FUROSEMIDE 10 MG/ML IJ SOLN
60.0000 mg | Freq: Two times a day (BID) | INTRAMUSCULAR | Status: DC
  Administered 2017-08-01 – 2017-08-02 (×3): 60 mg via INTRAVENOUS
  Filled 2017-08-01 (×3): qty 8

## 2017-08-01 MED ORDER — THIAMINE HCL 100 MG/ML IJ SOLN: 100.0000 mg | Freq: Every day | INTRAMUSCULAR | Status: DC

## 2017-08-01 MED ORDER — ADULT MULTIVITAMIN W/MINERALS CH: 1.0000 | ORAL_TABLET | Freq: Every day | ORAL | Status: DC

## 2017-08-01 MED ORDER — LORAZEPAM 1 MG PO TABS
1.0000 mg | ORAL_TABLET | Freq: Four times a day (QID) | ORAL | Status: DC | PRN
  Administered 2017-08-01: 1 mg via ORAL
  Filled 2017-08-01: qty 1

## 2017-08-01 MED ORDER — LORAZEPAM 2 MG/ML IJ SOLN: 1.0000 mg | Freq: Four times a day (QID) | INTRAMUSCULAR | Status: DC | PRN

## 2017-08-01 MED ORDER — FOLIC ACID 1 MG PO TABS: 1.0000 mg | ORAL_TABLET | Freq: Every day | ORAL | Status: DC

## 2017-08-01 MED ORDER — VITAMIN B-1 100 MG PO TABS
100.0000 mg | ORAL_TABLET | Freq: Every day | ORAL | Status: DC
  Administered 2017-08-01 – 2017-08-03 (×3): 100 mg via ORAL
  Filled 2017-08-01: qty 1

## 2017-08-01 MED ORDER — SODIUM CHLORIDE 0.9 % IV SOLN
1.0000 g | INTRAVENOUS | Status: DC
  Administered 2017-08-01 – 2017-08-02 (×2): 1 g via INTRAVENOUS
  Filled 2017-08-01 (×3): qty 10

## 2017-08-01 NOTE — Progress Notes (Signed)
Pt rechecked for O2.  Pt's O2 at 75% on RA and fluctuating up to 80%. Pt educated on reasons for needing O2 at this time.  Pt placed back on [facility] and pt is agreeable at this time.  Will continue to monitor.

## 2017-08-01 NOTE — Progress Notes (Signed)
Pharmacy Electrolyte Monitoring Consult:  Pharmacy consulted to assist in monitoring and replacing electrolytes in this 52 y.o. female admitted on 07/29/2017 with CHF exacerbation and alcohol withdrawal.   Patient receiving furosemide  IV BID.   Labs:  Sodium (mmol/L)  Date Value  08/01/2017 138  04/07/2013 134 (L)   Potassium (mmol/L)  Date Value  08/01/2017 4.0  04/07/2013 3.3 (L)   Magnesium (mg/dL)  Date Value  81/19/1478 1.8   Calcium (mg/dL)  Date Value  29/56/2130 7.8 (L)   Calcium, Total (mg/dL)  Date Value  86/57/8469 8.7   Albumin (g/dL)  Date Value  62/95/2841 3.3 (L)  04/07/2013 2.9 (L)    Plan:  Electrolytes WNL. No additional replacement needed at this time.   Pharmacy will continue to monitor and adjust per consult.   Gardner Candle, PharmD, BCPS Clinical Pharmacist 08/01/2017 7:25 AM

## 2017-08-01 NOTE — Progress Notes (Signed)
[facility] - [place] at [facility]   PATIENT NAME: Joanna Sanchez    MR#:  [id number]  DATE OF BIRTH:  November 11, 1965  SUBJECTIVE:   Patient continues to need 2 L oxygen.  Saturations dropping to 60s on room air.  Sitter at bedside.  Patient under IVC which was placed on 07/31/2017.  REVIEW OF SYSTEMS:    ROS  CONSTITUTIONAL: No documented fever. No fatigue, weakness. No weight gain, no weight loss.  EYES: No blurry or double vision.  ENT: No tinnitus. No postnasal drip. No redness of the oropharynx.  RESPIRATORY: No cough, no wheeze, no hemoptysis. Has  dyspnea.  CARDIOVASCULAR: No chest pain. No orthopnea. No palpitations. No syncope.  GASTROINTESTINAL: No nausea, no vomiting or diarrhea. No abdominal pain. No melena or hematochezia.  GENITOURINARY: No dysuria or hematuria.  ENDOCRINE: No polyuria or nocturia. No heat or cold intolerance.  HEMATOLOGY: No anemia. No bruising. No bleeding.  INTEGUMENTARY: No rashes. No lesions.  MUSCULOSKELETAL: No arthritis. No swelling. No gout.  NEUROLOGIC: No numbness, tingling, or ataxia. No seizure-type activity.  Has tremors upper extremities PSYCHIATRIC: No anxiety. No insomnia. No ADD.   DRUG ALLERGIES:   Allergies  Allergen Reactions  . Eliquis [Apixaban] Anaphylaxis  . Niacin And Related   . Other     Patient states that she had an anaphylactic reaction to a blood thinner but does not remember the name of it     VITALS:  Blood pressure 109/66, pulse (!) 116, temperature 98.6 F (37 C), temperature source Oral, resp. rate 19, height  (1.626 m), weight 51.5 kg (113 lb 8.6 oz), SpO2 95 %.  PHYSICAL EXAMINATION:   Physical Exam  GENERAL:  52 y.o.-year-old patient lying in the bed  EYES: Pupils equal, round, reactive to light and accommodation. No scleral icterus. Extraocular muscles intact.  HEENT: Head atraumatic, normocephalic. Oropharynx and nasopharynx clear.  NECK:  Supple, no jugular venous distention. No  thyroid enlargement, no tenderness.  LUNGS: Improved breath sounds bilaterally, bibasilar rales heard. No use of accessory muscles of respiration.  CARDIOVASCULAR: S1, S2 normal. No murmurs, rubs, or gallops.  ABDOMEN: Soft, nontender, nondistended. Bowel sounds present. No organomegaly or mass.  EXTREMITIES: No cyanosis, clubbing or edema b/l.    Tremors upper extremities NEUROLOGIC: Cranial nerves II through XII are intact. No focal Motor or sensory deficits b/l.   PSYCHIATRIC: The patient is alert and oriented x 2 SKIN: No obvious rash, lesion, or ulcer.   LABORATORY PANEL:   CBC Recent Labs  Lab 07/30/17 0621  WBC 6.1  HGB 9.6*  HCT 30.1*  PLT 127*   ------------------------------------------------------------------------------------------------------------------ Chemistries  Recent Labs  Lab 07/30/17 0621 07/31/17 0440 08/01/17 0529  NA 141 139 138  K 3.6 3.2* 4.0  CL 99* 94* 94*  CO2 33* 36* 31  GLUCOSE 78 80 83  BUN CREATININE 0.70 0.68 0.71  CALCIUM 7.6* 7.6* 7.8*  MG 1.4* 1.8  --   AST 150*  --   --   ALT 54  --   --   ALKPHOS 467*  --   --   BILITOT 1.0  --   --    ------------------------------------------------------------------------------------------------------------------  Cardiac Enzymes Recent Labs  Lab 07/29/17 1628  TROPONINI <0.03   ------------------------------------------------------------------------------------------------------------------  RADIOLOGY:  No results found.   ASSESSMENT AND PLAN:   52 year old female patient with history of hypertension, alcoholic liver disease, diastolic heart failure, alcohol abuse, neuropathy currently in stepdown unit for respiratory  distress.  -Acute on chronic combined systolic and diastolic heart failure exacerbation improving slowly IV Lasix for diuresis.  Increase Lasix to 60 mg IV twice daily Input output chart Fluid restriction Monitor electrolytes  -Acute respiratory  distress with hypoxia Off BiPAP On oxygen via nasal cannula Wean oxygen to room air as tolerated  - Alcoholic cardiomyopathy Alcohol abstinence  -Active alcohol withdrawal On CIWA protocol Scheduled Librium Substance abuse counseling given  -Patient involuntarily committed on 07/31/2017 as patient was confused and was leaving the hospital.  Considered danger to self  -DVT prophylaxis - Lovenox  -Anemia of chronic disease  All the records are reviewed and case discussed with Care Management/Social Worker. Management plans discussed with the patient, family and they are in agreement.  CODE STATUS: Full Code  DVT Prophylaxis: SCDs  TOTAL TIME TAKING CARE OF THIS PATIENT: 35 minutes.   POSSIBLE D/C IN 2-3 DAYS, DEPENDING ON CLINICAL CONDITION.  Molinda Bailiff Malanie Koloski M.D on 08/01/2017 at 1:14 PM  Between 7am to 6pm - Pager - [phone]  After 6pm go to [email or web address] - password [email or web address]  [facility] [place] [facility]  Office  [phone]  CC: Primary care physician; Care, [place] [facility]  Note: This dictation was prepared with Dragon dictation along with smaller phrase technology. Any transcriptional errors that result from this process are unintentional.

## 2017-08-01 NOTE — Consult Note (Signed)
[facility] Face-to-Face Psychiatry Consult   Reason for Consult: Consult for 52 year old woman with a history of alcohol abuse who is in the hospital with congestive heart failure and alcohol withdrawal Referring Physician: Sudini Patient Identification: Joanna Sanchez MRN:  [id number] Principal Diagnosis: Alcohol withdrawal delirium [facility]) Diagnosis:   Patient Active Problem List   Diagnosis Date Noted  . Alcohol withdrawal delirium ([place]) [F10.231] 08/01/2017  . Hepatic cirrhosis ([place]) [K74.60] 08/01/2017  . Alcohol abuse [F10.10] 08/01/2017  . CHF (congestive heart failure) ([place]) [I50.9] 07/29/2017    Total Time spent with patient: 1 hour  Subjective:   Joanna Sanchez is a 52 y.o. female patient admitted with "I have liver disease".  HPI: Patient was admitted to the hospital with acute shortness of breath and swelling in her legs but also is undergoing alcohol withdrawal.  Husband able to give much of the information.  Husband reports that prior to coming into the hospital patient was drinking as much as a full magnum of wine every day.  Not currently abusing any other drugs.  Mood stays down flat and confused.  No suicidal ideation.  Since being in the hospital the husband describes intermittent delirium and hallucinations.  No seizures.  Social history: Lives at home with her husband chronically ill.  Medical history: Cirrhosis, congestive heart failure, chronically severely ill  Substance abuse history: Has had long-standing substance abuse problem starting with prescription medicine in the 90s and then transitioning into alcohol.  End-stage alcohol abuse now.  Patient has had tremulous withdrawals possible DTs no known seizures.  Has been to detox 3 times previously rarely stay sober for long period  Past Psychiatric History: Patient's primary problem is been the substance abuse.  Has been to detox programs and treatment in the past.  Not currently following up.  Risk to Self: Is  patient at risk for suicide?: No Risk to Others:   Prior Inpatient Therapy:   Prior Outpatient Therapy:    Past Medical History:  Past Medical History:  Diagnosis Date  . Alcohol abuse   . CHF (congestive heart failure) ([place])   . Liver disease   . Neuropathy     Past Surgical History:  Procedure Laterality Date  . ABDOMINAL HYSTERECTOMY    . BREAST LUMPECTOMY Right   . TONSILLECTOMY     Family History:  Family History  Problem Relation Age of Onset  . Diabetes Father    Family Psychiatric  History: Unknown Social History:  Social History   Substance and Sexual Activity  Alcohol Use Yes     Social History   Substance and Sexual Activity  Drug Use No    Social History   Socioeconomic History  . Marital status: Married    Spouse name: Not on file  . Number of children: Not on file  . Years of education: Not on file  . Highest education level: Not on file  Occupational History  . Not on file  Social Needs  . Financial resource strain: Not on file  . Food insecurity:    Worry: Not on file    Inability: Not on file  . Transportation needs:    Medical: Not on file    Non-medical: Not on file  Tobacco Use  . Smoking status: Never Smoker  . Smokeless tobacco: Never Used  Substance and Sexual Activity  . Alcohol use: Yes  . Drug use: No  . Sexual activity: Not on file  Lifestyle  . Physical activity:  Days per week: Not on file    Minutes per session: Not on file  . Stress: Not on file  Relationships  . Social connections:    Talks on phone: Not on file    Gets together: Not on file    Attends religious service: Not on file    Active member of club or organization: Not on file    Attends meetings of clubs or organizations: Not on file    Relationship status: Not on file  Other Topics Concern  . Not on file  Social History Narrative  . Not on file   Additional Social History:    Allergies:   Allergies  Allergen Reactions  . Eliquis  [Apixaban] Anaphylaxis  . Niacin And Related   . Other     Patient states that she had an anaphylactic reaction to a blood thinner but does not remember the name of it     Labs:  Results for orders placed or performed during the hospital encounter of 07/29/17 (from the past 48 hour(s))  Basic metabolic panel     Status: Abnormal   Collection Time: 07/31/17  4:40 AM  Result Value Ref Range   Sodium 139 135 - 145 mmol/L   Potassium 3.2 (L) 3.5 - 5.1 mmol/L   Chloride 94 (L) 101 - 111 mmol/L   CO2 36 (H) 22 - 32 mmol/L   Glucose, Bld 80 65 - 99 mg/dL   BUN 11 6 - 20 mg/dL   Creatinine, Ser 0.68 0.44 - 1.00 mg/dL   Calcium 7.6 (L) 8.9 - 10.3 mg/dL   GFR calc non Af Amer >60 >60 mL/min   GFR calc Af Amer >60 >60 mL/min    Comment: (NOTE) The eGFR has been calculated using the CKD EPI equation. This calculation has not been validated in all clinical situations. eGFR's persistently <60 mL/min signify possible Chronic Kidney Disease.    Anion gap 9 5 - 15    Comment: Performed at [facility], [address] [place], [place] [address]  Magnesium     Status: None   Collection Time: 07/31/17  4:40 AM  Result Value Ref Range   Magnesium 1.8 1.7 - 2.4 mg/dL    Comment: Performed at [facility], [address] [place], [place] [address]  Basic metabolic panel     Status: Abnormal   Collection Time: 08/01/17  5:29 AM  Result Value Ref Range   Sodium 138 135 - 145 mmol/L   Potassium 4.0 3.5 - 5.1 mmol/L   Chloride 94 (L) 101 - 111 mmol/L   CO2 31 22 - 32 mmol/L   Glucose, Bld 83 65 - 99 mg/dL   BUN 11 6 - 20 mg/dL   Creatinine, Ser 0.71 0.44 - 1.00 mg/dL   Calcium 7.8 (L) 8.9 - 10.3 mg/dL   GFR calc non Af Amer >60 >60 mL/min   GFR calc Af Amer >60 >60 mL/min    Comment: (NOTE) The eGFR has been calculated using the CKD EPI equation. This calculation has not been validated in all clinical situations. eGFR's persistently <60 mL/min signify possible Chronic  Kidney Disease.    Anion gap 13 5 - 15    Comment: Performed at [facility], [address] [place],  [address]    Current Facility-Administered Medications  Medication Dose Route Frequency Provider Last Rate Last Dose  . albuterol (PROVENTIL) (2.5 MG/3ML) 0.083% nebulizer solution 2.5 mg  2.5 mg Nebulization Q2H PRN Sudini,  Srikar, MD      . carvedilol (COREG) tablet 12.5 mg  12.5 mg Oral BID WC Hillary Bow, MD   12.5 mg at 08/01/17 1645  . DULoxetine (CYMBALTA) DR capsule 60 mg  60 mg Oral BID Hillary Bow, MD   60 mg at 08/01/17 0924  . enoxaparin (LOVENOX) injection 40 mg  40 mg Subcutaneous Q24H Hillary Bow, MD   40 mg at 08/01/17 1821  . folic acid (FOLVITE) tablet 1 mg  1 mg Oral Daily Hillary Bow, MD   1 mg at 08/01/17 0924  . folic acid (FOLVITE) tablet 1 mg  1 mg Oral Daily Rhyse Loux T, MD      . furosemide (LASIX) injection 60 mg  60 mg Intravenous BID Hillary Bow, MD   60 mg at 08/01/17 1821  . gabapentin (NEURONTIN) capsule 300 mg  300 mg Oral TID Hillary Bow, MD   300 mg at 08/01/17 1514  . ipratropium-albuterol (DUONEB) 0.5-2.5 (3) MG/3ML nebulizer solution 3 mL  3 mL Nebulization Q6H PRN Pyreddy, Pavan, MD      . labetalol (NORMODYNE,TRANDATE) injection 10 mg  10 mg Intravenous Q4H PRN Hillary Bow, MD   10 mg at 07/29/17 1927  . loperamide (IMODIUM) capsule 2 mg  2 mg Oral TID PRN Hillary Bow, MD   2 mg at 07/30/17 1025  . LORazepam (ATIVAN) tablet 1 mg  1 mg Oral Q6H PRN Meryem Haertel, Madie Reno, MD       Or  . LORazepam (ATIVAN) injection 1 mg  1 mg Intravenous Q6H PRN Abcde Oneil T, MD      . magnesium oxide (MAG-OX) tablet 400 mg  400 mg Oral BID Saundra Shelling, MD   400 mg at 08/01/17 1962  . multivitamin with minerals tablet 1 tablet  1 tablet Oral Daily Hillary Bow, MD   1 tablet at 08/01/17 0924  . multivitamin with minerals tablet 1 tablet  1 tablet Oral Daily Dorotha Hirschi T, MD      . ondansetron [facility]) tablet 4 mg  4 mg  Oral Q6H PRN Hillary Bow, MD   4 mg at 07/30/17 1025   Or  . ondansetron (ZOFRAN) injection 4 mg  4 mg Intravenous Q6H PRN Hillary Bow, MD   4 mg at 08/01/17 2297  . oxyCODONE (Oxy IR/ROXICODONE) immediate release tablet 5 mg  5 mg Oral Q4H PRN Hillary Bow, MD   5 mg at 08/01/17 1649  . pantoprazole (PROTONIX) EC tablet 40 mg  40 mg Oral BID Hillary Bow, MD   40 mg at 08/01/17 0924  . QUEtiapine (SEROQUEL) tablet 100 mg  100 mg Oral QHS Hillary Bow, MD   100 mg at 07/31/17 2133  . rifaximin (XIFAXAN) tablet 550 mg  550 mg Oral BID Hillary Bow, MD   550 mg at 08/01/17 0924  . sodium chloride flush (NS) 0.9 % injection 3 mL  3 mL Intravenous Q12H Hillary Bow, MD   3 mL at 08/01/17 0925  . sucralfate (CARAFATE) tablet 1 g  1 g Oral QID Hillary Bow, MD   1 g at 08/01/17 1821  . thiamine (VITAMIN B-1) tablet 100 mg  100 mg Oral Daily Torria Fromer T, MD       Or  . thiamine (B-1) injection 100 mg  100 mg Intravenous Daily Boniface Goffe T, MD      . thiamine (VITAMIN B-1) tablet 100 mg  100 mg Oral Daily Sudini, Alveta Heimlich, MD   100 mg at  08/01/17 0925    Musculoskeletal: Strength & Muscle Tone: decreased Gait & Station: unable to stand Patient leans: N/A  Psychiatric Specialty Exam: Physical Exam  Nursing note and vitals reviewed. Constitutional: She appears well-developed.  HENT:  Head: Normocephalic and atraumatic.  Eyes: Pupils are equal, round, and reactive to light. Conjunctivae are normal.  Neck: Normal range of motion.  Cardiovascular: Regular rhythm and normal heart sounds.  Respiratory: Effort normal. No respiratory distress.  GI: Soft.  Musculoskeletal: Normal range of motion.  Neurological: She is alert.  Skin: Skin is warm and dry.     Psychiatric: Her affect is blunt. Her speech is delayed and tangential. She is slowed and withdrawn. Cognition and memory are impaired. She expresses no suicidal ideation.    Review of Systems  Constitutional: Negative.    HENT: Negative.   Eyes: Negative.   Respiratory: Positive for shortness of breath.   Cardiovascular: Positive for leg swelling.  Gastrointestinal: Positive for nausea.  Musculoskeletal: Negative.   Skin: Negative.   Neurological: Negative.   Psychiatric/Behavioral: Positive for memory loss and substance abuse. Negative for depression, hallucinations and suicidal ideas. The patient is nervous/anxious and has insomnia.     Blood pressure 116/73, pulse 83, temperature (!) 97.4 F (36.3 C), temperature source Oral, resp. rate (!) 21, height '5\' 4"'  (1.626 m), weight 51.5 kg (113 lb 8.6 oz), SpO2 93 %.Body mass index is 19.49 kg/m.  General Appearance: Disheveled  Eye Contact:  Poor  Speech:  Garbled  Volume:  Decreased  Mood:  Dysphoric  Affect:  Flat  Thought Process:  Disorganized  Orientation:  Negative  Thought Content:  Illogical  Suicidal Thoughts:  No  Homicidal Thoughts:  No  Memory:  Immediate;   Fair Recent;   Fair Remote;   Fair  Judgement:  Impaired  Insight:  Shallow  Psychomotor Activity:  Decreased  Concentration:  Concentration: Poor  Recall:  Poor  Fund of Knowledge:  Fair  Language:  Poor  Akathisia:  No  Handed:  Right  AIMS (if indicated):     Assets:  Social Support  ADL's:  Impaired  Cognition:  Impaired,  Moderate  Sleep:        Treatment Plan Summary: Medication management and Plan 52 year old woman with chronic alcohol dependence who continues to drink despite liver disease and heart disease.  Patient is currently having symptoms of confusion and agitation and intermittent hallucinations all typical of alcohol withdrawal.  According to the husband this is actually less severe than it has been at times in the past and he is hoping that this can avoid the worst of delirium tremens.  Patient will be placed on the regular detox protocol.  If she begins to hallucinate or get more agitated on a regular basis she might need an increase of medicine or  possibly transfer to the ICU.  Husband states that he does not want the patient sent home on any controlled substances as an outpatient detox because he is certain she will just abuse them along with the alcohol.  I think this is an excellent point and that she really needs to get detoxed in the hospital.  Orders placed for the regular protocol.  Disposition: Supportive therapy provided about ongoing stressors.  Alethia Berthold, MD 08/01/2017 7:03 PM

## 2017-08-01 NOTE — Progress Notes (Signed)
Chaplain was paged by unit secretary to visit Pt. Chaplain asked if it could be done after lunch as ch. Was in meeting. Chaplain visited Pt but Pt was tired and didn't want to talk at that time. Chaplain will follow up.   08/01/17 1400  Clinical Encounter Type  Visited With Patient  Visit Type Follow-up  Referral From Nurse  Spiritual Encounters  Spiritual Needs Emotional

## 2017-08-01 NOTE — Progress Notes (Signed)
Pharmacy Antibiotic Note  Joanna Sanchez is a 52 y.o. female admitted on 07/29/2017 with UTI.  Pharmacy has been consulted for ceftriaxone dosing.  Plan: Will start ceftriaxone 1g IV daily  Height:  (162.6 cm) Weight: 113 lb 8.6 oz (51.5 kg) IBW/kg (Calculated) : 54.7  Temp (24hrs), Avg:98 F (36.7 C), Min:97.4 F (36.3 C), Max:98.6 F (37 C)  Recent Labs  Lab 07/29/17 1628 07/30/17 0621 07/31/17 0440 08/01/17 0529  WBC 5.7 6.1  --   --   CREATININE 0.48 0.70 0.68 0.71    Estimated Creatinine Clearance: 68.4 mL/min (by C-G formula based on SCr of 0.71 mg/dL).    Allergies  Allergen Reactions  . Eliquis [Apixaban] Anaphylaxis  . Niacin And Related   . Other     Patient states that she had an anaphylactic reaction to a blood thinner but does not remember the name of it     Thank you for allowing pharmacy to be a part of this patient's care.  Thomasene Ripple, PharmD, BCPS Clinical Pharmacist 08/01/2017

## 2017-08-02 LAB — CBC
HEMATOCRIT: 28.2 % — AB (ref 35.0–47.0)
Hemoglobin: 9.4 g/dL — ABNORMAL LOW (ref 12.0–16.0)
MCH: 30.8 pg (ref 26.0–34.0)
MCHC: 33.2 g/dL (ref 32.0–36.0)
MCV: 92.8 fL (ref 80.0–100.0)
Platelets: 126 10*3/uL — ABNORMAL LOW (ref 150–440)
RBC: 3.04 MIL/uL — AB (ref 3.80–5.20)
RDW: 14.9 % — ABNORMAL HIGH (ref 11.5–14.5)
WBC: 5.2 10*3/uL (ref 3.6–11.0)

## 2017-08-02 LAB — BASIC METABOLIC PANEL
ANION GAP: 8 (ref 5–15)
BUN: 12 mg/dL (ref 6–20)
CO2: 38 mmol/L — AB (ref 22–32)
Calcium: 7.7 mg/dL — ABNORMAL LOW (ref 8.9–10.3)
Chloride: 92 mmol/L — ABNORMAL LOW (ref 101–111)
Creatinine, Ser: 0.52 mg/dL (ref 0.44–1.00)
GLUCOSE: 81 mg/dL (ref 65–99)
POTASSIUM: 2.7 mmol/L — AB (ref 3.5–5.1)
Sodium: 138 mmol/L (ref 135–145)

## 2017-08-02 LAB — PHOSPHORUS: Phosphorus: 3.2 mg/dL (ref 2.5–4.6)

## 2017-08-02 LAB — POTASSIUM: Potassium: 5.1 mmol/L (ref 3.5–5.1)

## 2017-08-02 LAB — MAGNESIUM: Magnesium: 1.5 mg/dL — ABNORMAL LOW (ref 1.7–2.4)

## 2017-08-02 MED ORDER — POTASSIUM CHLORIDE CRYS ER 20 MEQ PO TBCR
40.0000 meq | EXTENDED_RELEASE_TABLET | ORAL | Status: AC
  Administered 2017-08-02 (×2): 40 meq via ORAL
  Filled 2017-08-02 (×2): qty 2

## 2017-08-02 MED ORDER — MAGNESIUM SULFATE 2 GM/50ML IV SOLN
2.0000 g | Freq: Once | INTRAVENOUS | Status: AC
  Administered 2017-08-02: 2 g via INTRAVENOUS
  Filled 2017-08-02: qty 50

## 2017-08-02 MED ORDER — LORAZEPAM 0.5 MG PO TABS
0.5000 mg | ORAL_TABLET | Freq: Four times a day (QID) | ORAL | Status: DC | PRN
Start: 2017-08-02 — End: 2017-08-03
  Administered 2017-08-02 – 2017-08-03 (×4): 0.5 mg via ORAL
  Filled 2017-08-02 (×4): qty 1

## 2017-08-02 MED ORDER — LORAZEPAM 0.5 MG PO TABS: 0.5000 mg | ORAL_TABLET | Freq: Four times a day (QID) | ORAL | Status: DC | PRN

## 2017-08-02 MED ORDER — POTASSIUM CHLORIDE 20 MEQ PO PACK
40.0000 meq | PACK | ORAL | Status: DC
  Filled 2017-08-02: qty 2

## 2017-08-02 MED ORDER — LORAZEPAM 2 MG/ML IJ SOLN: 0.5000 mg | Freq: Four times a day (QID) | INTRAMUSCULAR | Status: DC | PRN

## 2017-08-02 MED ORDER — POTASSIUM CHLORIDE CRYS ER 20 MEQ PO TBCR
40.0000 meq | EXTENDED_RELEASE_TABLET | Freq: Every day | ORAL | Status: DC
  Administered 2017-08-03: 40 meq via ORAL
  Filled 2017-08-02: qty 2

## 2017-08-02 MED ORDER — POTASSIUM CHLORIDE 10 MEQ/100ML IV SOLN
10.0000 meq | INTRAVENOUS | Status: AC
  Administered 2017-08-02 (×3): 10 meq via INTRAVENOUS
  Filled 2017-08-02 (×2): qty 100

## 2017-08-02 NOTE — Clinical Social Work Note (Signed)
Clinical Social Work Assessment  Patient Details  Name: Joanna Sanchez MRN: [id number] Date of Birth: [birth year]  Date of referral:  08/02/17               Reason for consult:  Substance Use/ETOH Abuse                Permission sought to share information with:    Permission granted to share information::     Name::        Agency::     Relationship::     Contact Information:     Housing/Transportation Living arrangements for the past 2 months:  Single Family Home Source of Information:  Patient Patient Interpreter Needed:  None Criminal Activity/Legal Involvement Pertinent to Current Situation/Hospitalization:  No - Comment as needed Significant Relationships:  Adult Children, Spouse Lives with:  Spouse, Adult Children Do you feel safe going back to the place where you live?  Yes Need for family participation in patient care:  Yes (Comment)  Care giving concerns:  Patient lives in [place] with her husband Gershon Mussel.    Social Worker assessment / plan:  Holiday representative (CSW) reviewed chart and noted that patient is at [facility] for alcohol withdrawal. CSW met with patient alone at bedside to provide resources. Patient was alert and oriented X3 and was very guarded with her answers. Patient reported that she lives in [place] with her son and husband. CSW introduced self and explained CSW department. Per patient she does drink alcohol but she is not interested in substance abuse treatment. CSW provided patient with outpatient substance abuse treatment options in case she changes her mind. Please reconsult if future social work needs arise. CSW signing off.   Employment status:  Disabled (Comment on whether or not currently receiving Disability) Insurance information:  Other (Comment Required)([employer] ) PT Recommendations:  Not assessed at this time Information / Referral to community resources:  Outpatient Substance Abuse Treatment Options  Patient/Family's Response to care:   Patient reported that she is not interested in substance abuse treatment.   Patient/Family's Understanding of and Emotional Response to Diagnosis, Current Treatment, and Prognosis:  Patient was guarded with her answers and did not want resources.   Emotional Assessment Appearance:  Appears stated age Attitude/Demeanor/Rapport:  Guarded Affect (typically observed):  Quiet Orientation:  Oriented to Self, Oriented to Place, Oriented to  Time, Oriented to Situation Alcohol / Substance use:  Alcohol Use Psych involvement (Current and /or in the community):  Yes (Comment)(Psych is following patient )  Discharge Needs  Concerns to be addressed:  No discharge needs identified Readmission within the last 30 days:  No Current discharge risk:  Substance Abuse Barriers to Discharge:  Continued Medical Work up   [employer], Veronia Beets, LCSW 08/02/2017, 1:55 PM

## 2017-08-02 NOTE — Progress Notes (Signed)
[facility] [place] [facility]   PATIENT NAME: Joanna Sanchez    MR#:  [id number]  DATE OF BIRTH:  12/31/1965  SUBJECTIVE:   On 3 L oxygen.   Alert and oriented today.  IVC in place.  Sitter at bedside.  REVIEW OF SYSTEMS:    ROS  CONSTITUTIONAL: No documented fever. No fatigue, weakness. No weight gain, no weight loss.  EYES: No blurry or double vision.  ENT: No tinnitus. No postnasal drip. No redness of the oropharynx.  RESPIRATORY: No cough, no wheeze, no hemoptysis. Has  dyspnea.  CARDIOVASCULAR: No chest pain. No orthopnea. No palpitations. No syncope.  GASTROINTESTINAL: No nausea, no vomiting or diarrhea. No abdominal pain. No melena or hematochezia.  GENITOURINARY: No dysuria or hematuria.  ENDOCRINE: No polyuria or nocturia. No heat or cold intolerance.  HEMATOLOGY: No anemia. No bruising. No bleeding.  INTEGUMENTARY: No rashes. No lesions.  MUSCULOSKELETAL: No arthritis. No swelling. No gout.  NEUROLOGIC: No numbness, tingling, or ataxia. No seizure-type activity.  Has tremors upper extremities PSYCHIATRIC: No anxiety. No insomnia. No ADD.   DRUG ALLERGIES:   Allergies  Allergen Reactions  . Eliquis [Apixaban] Anaphylaxis  . Niacin And Related   . Other     Patient states that she had an anaphylactic reaction to a blood thinner but does not remember the name of it     VITALS:  Blood pressure 121/79, pulse 99, temperature 97.8 F (36.6 C), temperature source Oral, resp. rate 16, height  (1.626 m), weight 50.1 kg (110 lb 7.2 oz), SpO2 99 %.  PHYSICAL EXAMINATION:   Physical Exam  GENERAL:  52 y.o.-year-old patient lying in the bed  EYES: Pupils equal, round, reactive to light and accommodation. No scleral icterus. Extraocular muscles intact.  HEENT: Head atraumatic, normocephalic. Oropharynx and nasopharynx clear.  NECK:  Supple, no jugular venous distention. No thyroid enlargement, no tenderness.  LUNGS: Improved breath sounds  bilaterally, bibasilar rales heard. No use of accessory muscles of respiration.  CARDIOVASCULAR: S1, S2 normal. No murmurs, rubs, or gallops.  ABDOMEN: Soft, nontender, nondistended. Bowel sounds present. No organomegaly or mass.  EXTREMITIES: No cyanosis, clubbing or edema b/l.    Tremors upper extremities NEUROLOGIC: Cranial nerves II through XII are intact. No focal Motor or sensory deficits b/l.   PSYCHIATRIC: The patient is alert and oriented x 3 SKIN: No obvious rash, lesion, or ulcer.   LABORATORY PANEL:   CBC Recent Labs  Lab 08/02/17 0327  WBC 5.2  HGB 9.4*  HCT 28.2*  PLT 126*   ------------------------------------------------------------------------------------------------------------------ Chemistries  Recent Labs  Lab 07/30/17 0621  08/02/17 0327  NA 141   < > 138  K 3.6   < > 2.7*  CL 99*   < > 92*  CO2 33*   < > 38*  GLUCOSE 78   < > 81  BUN 13   < > 12  CREATININE 0.70   < > 0.52  CALCIUM 7.6*   < > 7.7*  MG 1.4*   < > 1.5*  AST 150*  --   --   ALT 54  --   --   ALKPHOS 467*  --   --   BILITOT 1.0  --   --    < > = values in this interval not displayed.   ------------------------------------------------------------------------------------------------------------------  Cardiac Enzymes Recent Labs  Lab 07/29/17 1628  TROPONINI <0.03   ------------------------------------------------------------------------------------------------------------------  RADIOLOGY:  No results found.   ASSESSMENT AND PLAN:  52 year old female patient with history of hypertension, alcoholic liver disease, diastolic heart failure, alcohol abuse, neuropathy currently in stepdown unit for respiratory distress.  -Acute on chronic combined systolic and diastolic heart failure exacerbation improving slowly IV Lasix for diuresis.  Lasix 60 mg IV twice daily Input output chart Fluid restriction Monitor electrolytes  Continues to have significant hypokalemia.   Replaced today.  Likely patient can be discharged back home tomorrow.  -Acute respiratory distress with hypoxia Off BiPAP On oxygen via nasal cannula Wean oxygen to room air as tolerated  - Alcoholic cardiomyopathy Alcohol abstinence  -Active alcohol withdrawal On CIWA protocol Scheduled Librium Substance abuse counseling given  - Patient involuntarily committed on 07/31/2017 as patient was confused and was leaving the hospital.  Considered danger to self Patient is awake and oriented today.  Discontinued IVC.  -DVT prophylaxis - Lovenox  -Anemia of chronic disease  All the records are reviewed and case discussed with Care Management/Social Worker. Management plans discussed with the patient, family and they are in agreement.  CODE STATUS: Full Code  DVT Prophylaxis: SCDs  TOTAL TIME TAKING CARE OF THIS PATIENT: 35 minutes.   POSSIBLE D/C IN 2-3 DAYS, DEPENDING ON CLINICAL CONDITION.  Molinda Bailiff Murvin Gift M.D on 08/02/2017 at 1:28 PM  Between 7am to 6pm - Pager - [phone]  After 6pm go to [email or web address] - password [email or web address]  [facility]  Office  [phone]  CC: Primary care physician; Care, [facility]  Note: This dictation was prepared with Dragon dictation along with smaller phrase technology. Any transcriptional errors that result from this process are unintentional.

## 2017-08-02 NOTE — Progress Notes (Addendum)
Pharmacy Electrolyte Monitoring Consult:  Pharmacy consulted to assist in monitoring and replacing electrolytes in this 52 y.o. female admitted on 07/29/2017 with CHF exacerbation and alcohol withdrawal.   Patient receiving furosemide  IV BID.   Labs:  Sodium (mmol/L)  Date Value  08/02/2017 138  04/07/2013 134 (L)   Potassium (mmol/L)  Date Value  08/02/2017 2.7 (LL)  04/07/2013 3.3 (L)   Magnesium (mg/dL)  Date Value  96/06/5407 1.5 (L)   Phosphorus (mg/dL)  Date Value  81/19/1478 3.2   Calcium (mg/dL)  Date Value  29/56/2130 7.7 (L)   Calcium, Total (mg/dL)  Date Value  86/57/8469 8.7   Albumin (g/dL)  Date Value  62/95/2841 3.3 (L)  04/07/2013 2.9 (L)    Plan:  5/10 AM: K: 2.7, Mg: 1.5 - Patient receiving furosemide  IV BID. Will replace with KCL IV x 3 doses and KCL every 4 hours x 2 doses. Will also order magnesium 2g IV x 1 dose. Will order KCL daily while on Lasix.   Recheck electrolytes with AM labs.   Pharmacy will continue to monitor and replace per consult.   Gardner Candle, PharmD, BCPS Clinical Pharmacist 08/02/2017 7:14 AM

## 2017-08-03 LAB — BASIC METABOLIC PANEL
Anion gap: 6 (ref 5–15)
BUN: 12 mg/dL (ref 6–20)
CO2: 36 mmol/L — AB (ref 22–32)
Calcium: 8.1 mg/dL — ABNORMAL LOW (ref 8.9–10.3)
Chloride: 95 mmol/L — ABNORMAL LOW (ref 101–111)
Creatinine, Ser: 0.5 mg/dL (ref 0.44–1.00)
GFR calc Af Amer: >60 mL/min (ref >60)
GFR calc non Af Amer: >60 mL/min (ref >60)
GLUCOSE: 93 mg/dL (ref 65–99)
POTASSIUM: 3.6 mmol/L (ref 3.5–5.1)
Sodium: 137 mmol/L (ref 135–145)

## 2017-08-03 LAB — MAGNESIUM: Magnesium: 2 mg/dL (ref 1.7–2.4)

## 2017-08-03 MED ORDER — THIAMINE HCL 100 MG PO TABS: 100.0000 mg | ORAL_TABLET | Freq: Every day | ORAL | 0 refills | Status: DC

## 2017-08-03 MED ORDER — MAGNESIUM OXIDE 400 (241.3 MG) MG PO TABS: 400.0000 mg | ORAL_TABLET | Freq: Every day | ORAL | 0 refills | Status: DC

## 2017-08-03 MED ORDER — FUROSEMIDE 10 MG/ML IJ SOLN: 40.0000 mg | Freq: Every day | INTRAMUSCULAR | Status: DC

## 2017-08-03 MED ORDER — FUROSEMIDE 10 MG/ML IJ SOLN
INTRAMUSCULAR | Status: AC
  Administered 2017-08-03: 40 mg
  Filled 2017-08-03: qty 4

## 2017-08-03 MED ORDER — FOLIC ACID 1 MG PO TABS: 1.0000 mg | ORAL_TABLET | Freq: Every day | ORAL | 0 refills | Status: DC

## 2017-08-03 MED ORDER — CEFDINIR 300 MG PO CAPS: 300.0000 mg | ORAL_CAPSULE | Freq: Two times a day (BID) | ORAL | 0 refills | Status: DC

## 2017-08-03 MED ORDER — FUROSEMIDE 20 MG PO TABS: 20.0000 mg | ORAL_TABLET | Freq: Every day | ORAL | 0 refills | Status: DC

## 2017-08-03 MED ORDER — POTASSIUM CHLORIDE ER 20 MEQ PO TBCR: 20.0000 meq | EXTENDED_RELEASE_TABLET | Freq: Every day | ORAL | 0 refills | Status: DC

## 2017-08-03 NOTE — Progress Notes (Signed)
Pharmacy Electrolyte Monitoring Consult:  Pharmacy consulted to assist in monitoring and replacing electrolytes in this 52 y.o. female admitted on 07/29/2017 with CHF exacerbation and alcohol withdrawal.   Patient receiving furosemide  IV BID.   Labs:  Sodium (mmol/L)  Date Value  08/03/2017 137  04/07/2013 134 (L)   Potassium (mmol/L)  Date Value  08/03/2017 3.6  04/07/2013 3.3 (L)   Magnesium (mg/dL)  Date Value  16/12/9602 2.0   Phosphorus (mg/dL)  Date Value  54/11/8117 3.2   Calcium (mg/dL)  Date Value  14/78/2956 8.1 (L)   Calcium, Total (mg/dL)  Date Value  21/30/8657 8.7   Albumin (g/dL)  Date Value  84/69/6295 3.3 (L)  04/07/2013 2.9 (L)    Plan:  5/11 Mg: 2.0, K: 3.6- no additional replacement needed at this time.   Recheck electrolytes with AM labs.   Pharmacy will continue to monitor and replace per consult.   Gardner Candle, PharmD, BCPS Clinical Pharmacist 08/03/2017 11:40 AM

## 2017-08-03 NOTE — Discharge Summary (Addendum)
[facility] - [place] [facility]   PATIENT NAME: Joanna Sanchez    MR#:  [id number]  DATE OF BIRTH:  Aug 08, 1965  DATE OF ADMISSION:  07/29/2017 ADMITTING PHYSICIAN: Milagros Loll, MD  DATE OF DISCHARGE: No discharge date for patient encounter.  PRIMARY CARE PHYSICIAN: [facility], [facility] [facility]    ADMISSION DIAGNOSIS:  Anxiety [F41.9] Alcohol withdrawal syndrome with complication (HCC) [F10.239] Acute on chronic congestive heart failure, unspecified heart failure type (HCC) [I50.9]  DISCHARGE DIAGNOSIS:  Principal Problem:   Alcohol withdrawal delirium (HCC) Active Problems:   CHF (congestive heart failure) (HCC)   Hepatic cirrhosis (HCC)   Alcohol abuse   SECONDARY DIAGNOSIS:   Past Medical History:  Diagnosis Date  . Alcohol abuse   . CHF (congestive heart failure) (HCC)   . Liver disease   . Neuropathy     HOSPITAL COURSE:  52 year old female patient with history of hypertension, alcoholic liver disease, diastolic heart failure, alcohol abuse, neuropathy currently in stepdown unit for respiratory distress.  -Acute on chronic combined systolic and diastolic heart failure exacerbation Resolved Treated on our congestive heart failure protocol, treated with IV Lasix while in house  -Acute hypokalemia Secondary to alcoholism Repleted  -Acute respiratory distress with hypoxia Resolved Successfully weaned off BiPAP and oxygen via nasal cannula  - Alcoholic cardiomyopathy, acute Alcohol abstinence strongly advised/encouraged  -Active alcohol withdrawal Resolved Was treated on our alcohol withdrawal protocol while in house Substance abuse counseling given  - Patient involuntarily committed on 07/31/2017 as patient was confused and was leaving the hospital.  Considered danger to self Discontinued IVC Aug 02, 2017  DISCHARGE CONDITIONS:   stable  CONSULTS OBTAINED:  Treatment Team:  Audery Amel, MD  DRUG ALLERGIES:    Allergies  Allergen Reactions  . Eliquis [Apixaban] Anaphylaxis  . Niacin And Related   . Other     Patient states that she had an anaphylactic reaction to a blood thinner but does not remember the name of it     DISCHARGE MEDICATIONS:   Allergies as of 08/03/2017      Reactions   Eliquis [apixaban] Anaphylaxis   Niacin And Related    Other    Patient states that she had an anaphylactic reaction to a blood thinner but does not remember the name of it       Medication List    TAKE these medications   BIOTIN 5000 5 MG Caps Generic drug:  Biotin Take 1 capsule by mouth daily.   butalbital-acetaminophen-caffeine 50-325-40 MG tablet Commonly known as:  FIORICET, ESGIC Take 1 tablet by mouth 2 (two) times daily as needed for headache or migraine.   carvedilol 12.5 MG tablet Commonly known as:  COREG Take 12.5 mg by mouth 2 (two) times daily with a meal.   cefdinir 300 MG capsule Commonly known as:  OMNICEF Take 1 capsule (300 mg total) by mouth 2 (two) times daily.   CENTRUM ULTRA WOMENS Tabs Take 1 tablet by mouth daily.   cetirizine 10 MG tablet Commonly known as:  ZYRTEC Take 1 tablet by mouth daily.   DULoxetine 60 MG capsule Commonly known as:  CYMBALTA Take 60 mg by mouth 2 (two) times daily.   folic acid 1 MG tablet Commonly known as:  FOLVITE Take 1 tablet (1 mg total) by mouth daily. Start taking on:  08/04/2017   furosemide 20 MG tablet Commonly known as:  LASIX Take 1 tablet (20 mg total) by mouth daily.   gabapentin  300 MG capsule Commonly known as:  NEURONTIN Take 300 mg by mouth 3 (three) times daily.   loperamide 2 MG capsule Commonly known as:  IMODIUM Take 2 mg by mouth 3 (three) times daily as needed for diarrhea or loose stools.   magnesium oxide 400 (241.3 Mg) MG tablet Commonly known as:  MAG-OX Take 1 tablet (400 mg total) by mouth daily.   ondansetron 4 MG tablet Commonly known as:  ZOFRAN Take 4 mg by mouth every 8 (eight)  hours as needed for nausea or vomiting.   oxyCODONE 5 MG immediate release tablet Commonly known as:  Oxy IR/ROXICODONE Take 5 mg by mouth 2 (two) times daily as needed for moderate pain, severe pain or breakthrough pain.   pantoprazole 40 MG tablet Commonly known as:  PROTONIX Take 1 tablet by mouth 2 (two) times daily.   Potassium Chloride ER 20 MEQ Tbcr Take 20 mEq by mouth daily.   QUEtiapine 100 MG tablet Commonly known as:  SEROQUEL Take 100 mg by mouth at bedtime.   rifaximin 550 MG Tabs tablet Commonly known as:  XIFAXAN Take 550 mg by mouth 2 (two) times daily.   sucralfate 1 g tablet Commonly known as:  CARAFATE Take 1 tablet by mouth 4 (four) times daily.   thiamine 100 MG tablet Take 1 tablet (100 mg total) by mouth daily. Start taking on:  08/04/2017        DISCHARGE INSTRUCTIONS:  If you experience worsening of your admission symptoms, develop shortness of breath, life threatening emergency, suicidal or homicidal thoughts you must seek medical attention immediately by calling 911 or calling your MD immediately  if symptoms less severe.  You Must read complete instructions/literature along with all the possible adverse reactions/side effects for all the Medicines you take and that have been prescribed to you. Take any new Medicines after you have completely understood and accept all the possible adverse reactions/side effects.   Please note  You were cared for by a hospitalist during your hospital stay. If you have any questions about your discharge medications or the care you received while you were in the hospital after you are discharged, you can call the unit and asked to speak with the hospitalist on call if the hospitalist that took care of you is not available. Once you are discharged, your primary care physician will handle any further medical issues. Please note that NO REFILLS for any discharge medications will be authorized once you are discharged, as it  is imperative that you return to your primary care physician (or establish a relationship with a primary care physician if you do not have one) for your aftercare needs so that they can reassess your need for medications and monitor your lab values.    Today   CHIEF COMPLAINT:   Chief Complaint  Patient presents with  . Shortness of Breath    HISTORY OF PRESENT ILLNESS:  52 y.o. female with a known history of hypertension, alcoholic liver disease, chronic diastolic CHF, alcohol abuse, neuropathy presents to the emergency room due to worsening shortness of breath since yesterday.  Patient drinks one large bottle of liquor daily.  She has felt increasing shortness of breath, orthopnea and lower examinee swelling since yesterday.  Here in the emergency room patient is extremely tachypneic, tachycardic.  She has not had any alcohol in 24 hours.  Needing 3 L oxygen.  Chest x-ray shows significant pulmonary edema and bilateral pleural effusions. Patient is being admitted for acute  on chronic diastolic congestive heart failure and alcohol withdrawal.  Patient had a long stay at [facility] in September 2018 when she was on hemodialysis and had congestive heart failure.  Later her renal function returned to normal.  She was also intubated at that time.    VITAL SIGNS:  Blood pressure 124/83, pulse (!) 113, temperature 97.8 F (36.6 C), temperature source Oral, resp. rate 15, height  (1.626 m), weight 51.7 kg (114 lb), SpO2 99 %.  I/O:    Intake/Output Summary (Last 24 hours) at 08/03/2017 1155 Last data filed at 08/03/2017 1033 Gross per 24 hour  Intake 1000 ml  Output 300 ml  Net 700 ml    PHYSICAL EXAMINATION:  GENERAL:  52 y.o.-year-old patient lying in the bed with no acute distress.  EYES: Pupils equal, round, reactive to light and accommodation. No scleral icterus. Extraocular muscles intact.  HEENT: Head atraumatic, normocephalic. Oropharynx and nasopharynx clear.   NECK:  Supple, no jugular venous distention. No thyroid enlargement, no tenderness.  LUNGS: Normal breath sounds bilaterally, no wheezing, rales,rhonchi or crepitation. No use of accessory muscles of respiration.  CARDIOVASCULAR: S1, S2 normal. No murmurs, rubs, or gallops.  ABDOMEN: Soft, non-tender, non-distended. Bowel sounds present. No organomegaly or mass.  EXTREMITIES: No pedal edema, cyanosis, or clubbing.  NEUROLOGIC: Cranial nerves II through XII are intact. Muscle strength 5/5 in all extremities. Sensation intact. Gait not checked.  PSYCHIATRIC: The patient is alert and oriented x 3.  SKIN: No obvious rash, lesion, or ulcer.   DATA REVIEW:   CBC Recent Labs  Lab 08/02/17 0327  WBC 5.2  HGB 9.4*  HCT 28.2*  PLT 126*    Chemistries  Recent Labs  Lab 07/30/17 0621  08/03/17 0428  NA 141   < > 137  K 3.6   < > 3.6  CL 99*   < > 95*  CO2 33*   < > 36*  GLUCOSE 78   < > 93  BUN 13   < > 12  CREATININE 0.70   < > 0.50  CALCIUM 7.6*   < > 8.1*  MG 1.4*   < > 2.0  AST 150*  --   --   ALT 54  --   --   ALKPHOS 467*  --   --   BILITOT 1.0  --   --    < > = values in this interval not displayed.    Cardiac Enzymes Recent Labs  Lab 07/29/17 1628  TROPONINI <0.03    Microbiology Results  Results for orders placed or performed during the hospital encounter of 07/29/17  MRSA PCR Screening     Status: None   Collection Time: 07/29/17  9:52 PM  Result Value Ref Range Status   MRSA by PCR NEGATIVE NEGATIVE Final    Comment:        The GeneXpert MRSA Assay (FDA approved for NASAL specimens only), is one component of a comprehensive MRSA colonization surveillance program. It is not intended to diagnose MRSA infection nor to guide or monitor treatment for MRSA infections. Performed at [facility], [address], [place], [place] [address]     RADIOLOGY:  No results found.  EKG:   Orders placed or performed during the hospital encounter of  07/29/17  . ED EKG  . ED EKG      Management plans discussed with the patient, family and they are in agreement.  CODE STATUS:  Code Status Orders  (From admission, onward)        Start     Ordered   07/29/17 1856  Full code  Continuous     07/29/17 1856    Code Status History    This patient has a current code status but no historical code status.      TOTAL TIME TAKING CARE OF THIS PATIENT: 45 minutes.    Joanna Sanchez M.D on 08/03/2017 at 11:55 AM  Between 7am to 6pm - Pager - [phone]  After 6pm go to [email or web address] - password [email or web address]  [facility]  Office  [phone]  CC: Primary care physician; [facility] [facility]   Note: This dictation was prepared with Dragon dictation along with smaller phrase technology. Any transcriptional errors that result from this process are unintentional.

## 2017-08-04 LAB — URINE CULTURE: SPECIAL REQUESTS: NORMAL

## 2017-08-12 NOTE — Progress Notes (Deleted)
   Patient ID: Joanna Sanchez, female    DOB: 09/28/1965, 52 y.o.   MRN: [id number]  HPI  Joanna Sanchez is a 52 y/o female with a history of  Echo report from 07/30/17 reviewed and showed an EF of 20-25% along with moderate MR, severe TR and an elevated PA pressure of 50-55 mm Hg.   Admitted 07/29/17 due to acute on chronic HF. Initially needed IV lasix and then transitioned to oral diuretics. Hypokalemia due to alcohol use and potassium was replenished. Initially needed bipap and then weaned to room air. Alcohol withdrawal symptoms treated. Patient was involuntary committed due to confusion and her wanting to leave the hospital and was considered a danger to herself. Psychiatry consulted. IVC discontinued 2 days later. She was discharged after 5 days.   She presents today for her initial visit with a chief complaint of   Review of Systems    Physical Exam  Assessment & Plan:  1: Chronic heart failure with reduced ejection fraction- - NYHA class - BNP 07/29/17 was 2509.0  2: HTN- - saw PCP ([place]) 05/09/17 - BMP 08/03/17 reviewed and showed sodium 137, potassium 3.6 and GFR >60  3: Alcohol use-  - Alcohol, Ethyl (B) level on 04/06/17 was 139 (normal is <10)

## 2017-08-13 ENCOUNTER — Telehealth: Payer: Self-pay | Admitting: Family

## 2017-08-13 ENCOUNTER — Ambulatory Visit: Admitting: Family

## 2017-08-13 NOTE — Telephone Encounter (Signed)
Patient did not show for her [facility] appointment on 08/13/17. Will attempt to reschedule.

## 2017-11-25 ENCOUNTER — Encounter: Payer: Self-pay | Admitting: *Deleted

## 2017-11-25 ENCOUNTER — Emergency Department
Admission: EM | Admit: 2017-11-25 | Discharge: 2017-11-26 | Disposition: A | Discharge status: Home / Self Care | Attending: Emergency Medicine | Admitting: Emergency Medicine

## 2017-11-25 ENCOUNTER — Other Ambulatory Visit: Payer: Self-pay

## 2017-11-25 DIAGNOSIS — Z79899 Other long term (current) drug therapy: Secondary | ICD-10-CM | POA: Insufficient documentation

## 2017-11-25 DIAGNOSIS — F10929 Alcohol use, unspecified with intoxication, unspecified: Secondary | ICD-10-CM

## 2017-11-25 DIAGNOSIS — I509 Heart failure, unspecified: Secondary | ICD-10-CM | POA: Diagnosis not present

## 2017-11-25 DIAGNOSIS — F1010 Alcohol abuse, uncomplicated: Secondary | ICD-10-CM | POA: Diagnosis present

## 2017-11-25 DIAGNOSIS — F329 Major depressive disorder, single episode, unspecified: Secondary | ICD-10-CM | POA: Insufficient documentation

## 2017-11-25 DIAGNOSIS — Y908 Blood alcohol level of 240 mg/100 ml or more: Secondary | ICD-10-CM | POA: Diagnosis not present

## 2017-11-25 DIAGNOSIS — K7460 Unspecified cirrhosis of liver: Secondary | ICD-10-CM | POA: Diagnosis present

## 2017-11-25 LAB — COMPREHENSIVE METABOLIC PANEL
ALT: 83 U/L — ABNORMAL HIGH (ref 0–44)
ANION GAP: 7 (ref 5–15)
AST: 347 U/L — ABNORMAL HIGH (ref 15–41)
Albumin: 3.1 g/dL — ABNORMAL LOW (ref 3.5–5.0)
Alkaline Phosphatase: 429 U/L — ABNORMAL HIGH (ref 38–126)
BILIRUBIN TOTAL: 0.8 mg/dL (ref 0.3–1.2)
BUN: 15 mg/dL (ref 6–20)
CO2: 31 mmol/L (ref 22–32)
Calcium: 8.2 mg/dL — ABNORMAL LOW (ref 8.9–10.3)
Chloride: 103 mmol/L (ref 98–111)
Creatinine, Ser: 0.7 mg/dL (ref 0.44–1.00)
GFR calc non Af Amer: >60 mL/min (ref >60)
Glucose, Bld: 104 mg/dL — ABNORMAL HIGH (ref 70–99)
POTASSIUM: 5.6 mmol/L — AB (ref 3.5–5.1)
Sodium: 141 mmol/L (ref 135–145)
TOTAL PROTEIN: 6.8 g/dL (ref 6.5–8.1)

## 2017-11-25 LAB — ETHANOL: ALCOHOL ETHYL (B): 307 mg/dL — AB (ref <10)

## 2017-11-25 LAB — CBC
HCT: 34.3 % — ABNORMAL LOW (ref 35.0–47.0)
HEMOGLOBIN: 11.7 g/dL — AB (ref 12.0–16.0)
MCH: 30 pg (ref 26.0–34.0)
MCHC: 34.1 g/dL (ref 32.0–36.0)
MCV: 87.8 fL (ref 80.0–100.0)
Platelets: 423 10*3/uL (ref 150–440)
RBC: 3.91 MIL/uL (ref 3.80–5.20)
RDW: 17 % — ABNORMAL HIGH (ref 11.5–14.5)
WBC: 8.1 10*3/uL (ref 3.6–11.0)

## 2017-11-25 NOTE — ED Triage Notes (Signed)
Pt brought in by [employer].  Pt is IVC,  Pt reports etoh use and prescription meds.  Denies SI or HI.  Pt calm and cooperative

## 2017-11-25 NOTE — ED Notes (Signed)
Pts. Jewelry (two rings) locked up in valuables envelope by security.

## 2017-11-25 NOTE — ED Notes (Signed)
Pt. Transferred from Triage to room 20 after dressing out and screening for contraband. Pt. Oriented to Quad including Q15 minute rounds as well as Rover and Officer for their protection. Patient is alert and oriented, warm and dry in no acute distress. Patient denies SI, HI, and AVH. Pt. Encouraged to let me know if needs arise.     

## 2017-11-25 NOTE — ED Notes (Signed)
Hourly rounding reveals patient in room. No complaints, stable, in no acute distress. Q15 minute rounds and monitoring via Rover and Officer to continue.   

## 2017-11-25 NOTE — ED Provider Notes (Signed)
[facility] Emergency Department Provider Note  Time seen: 11:00 PM.  I have reviewed the triage vital signs and the nursing notes.   HISTORY  Chief Complaint Behavior Problem    HPI Joanna Sanchez is a 52 y.o. female with below list of chronic medical conditions including alcohol abuse presents to the emergency department involuntarily committed by her husband secondary to EtOH abuse and prescription drug abuse per the IVC documentation.  Patient states that "I drank 2 glasses of wine today".  Patient denies taking any illicit drugs and states that she took her home medications as prescribed.  She denies taking any narcotics.  Patient denies any suicidal homicidal ideation patient states that this is the fifth time her husband has committed her.  Past Medical History:  Diagnosis Date  . Alcohol abuse   . CHF (congestive heart failure) (HCC)   . Liver disease   . Neuropathy     Patient Active Problem List   Diagnosis Date Noted  . Alcohol withdrawal delirium (HCC) 08/01/2017  . Hepatic cirrhosis (HCC) 08/01/2017  . Alcohol abuse 08/01/2017  . CHF (congestive heart failure) (HCC) 07/29/2017    Past Surgical History:  Procedure Laterality Date  . ABDOMINAL HYSTERECTOMY    . BREAST LUMPECTOMY Right   . TONSILLECTOMY      Prior to Admission medications   Medication Sig Start Date End Date Taking? Authorizing Provider  azelastine (OPTIVAR) 0.05 % ophthalmic solution Place 1 drop into both eyes daily. 11/22/17  Yes [provider]  Biotin (BIOTIN 5000) 5 MG CAPS Take 1 capsule by mouth daily.   Yes [provider]  carvedilol (COREG) 12.5 MG tablet Take 12.5 mg by mouth 2 (two) times daily with a meal.   Yes [provider]  DULoxetine (CYMBALTA) 60 MG capsule Take 60 mg by mouth 2 (two) times daily.   Yes [provider]  Multiple Vitamins-Minerals (CENTRUM ULTRA WOMENS) TABS Take 1 tablet by mouth daily.   Yes  [provider]  pantoprazole (PROTONIX) 40 MG tablet Take 1 tablet by mouth 2 (two) times daily. 05/09/17  Yes [provider]  potassium chloride SA (K-DUR,KLOR-CON) 20 MEQ tablet Take 1 tablet by mouth daily. 11/22/17  Yes [provider]  QUEtiapine (SEROQUEL) 100 MG tablet Take 100 mg by mouth at bedtime.   Yes [provider]  rifaximin (XIFAXAN) 550 MG TABS tablet Take 550 mg by mouth 2 (two) times daily.   Yes [provider]  sucralfate (CARAFATE) 1 g tablet Take 1 tablet by mouth 4 (four) times daily. 07/03/17  Yes [provider]  oxyCODONE (OXY IR/ROXICODONE) 5 MG immediate release tablet Take 5 mg by mouth 2 (two) times daily as needed for moderate pain, severe pain or breakthrough pain.     [provider]    Allergies Eliquis [apixaban]; Niacin and related; and Other  Family History  Problem Relation Age of Onset  . Diabetes Father     Social History Social History   Tobacco Use  . Smoking status: Never Smoker  . Smokeless tobacco: Never Used  Substance Use Topics  . Alcohol use: Yes  . Drug use: No    Review of Systems Constitutional: No fever/chills Eyes: No visual changes. ENT: No sore throat. Cardiovascular: Denies chest pain. Respiratory: Denies shortness of breath. Gastrointestinal: No abdominal pain.  No nausea, no vomiting.  No diarrhea.  No constipation. Genitourinary: Negative for dysuria. Musculoskeletal: Negative for neck pain.  Negative  for back pain. Integumentary: Negative for rash. Neurological: Negative for headaches, focal weakness or numbness.   ____________________________________________   PHYSICAL EXAM:  VITAL SIGNS: ED Triage Vitals  Enc Vitals Group     BP 11/25/17 2151 136/89     Pulse Rate 11/25/17 2151 (!) 115     Resp 11/25/17 2151 18     Temp 11/25/17 2151 98.8 F (37.1 C)     Temp Source 11/25/17 2151 Oral     SpO2 11/25/17 2151 96 %     Weight 11/25/17 2152  52.2 kg (115 lb)     Height 11/25/17 2152 1.626 m (5\' 4" )     Head Circumference --      Peak Flow --      Pain Score 11/25/17 2152 0     Pain Loc --      Pain Edu? --      Excl. in GC? --     Constitutional: Alert and oriented.  Appears intoxicated. Eyes: Conjunctivae are normal. PERRL. EOMI. Head: Atraumatic. Mouth/Throat: Mucous membranes are moist. Oropharynx non-erythematous. Neck: No stridor.   Cardiovascular: Normal rate, regular rhythm. Good peripheral circulation. Grossly normal heart sounds. Respiratory: Normal respiratory effort.  No retractions. Lungs CTAB. Gastrointestinal: Soft and nontender. No distention.  Musculoskeletal: No lower extremity tenderness nor edema. No gross deformities of extremities. Neurologic:  Normal speech and language. No gross focal neurologic deficits are appreciated.  Skin:  Skin is warm, dry and intact. No rash noted. Psychiatric: Appears intoxicated.Marland Kitchen Speech and behavior are normal.  ____________________________________________   LABS (all labs ordered are listed, but only abnormal results are displayed)  Labs Reviewed  COMPREHENSIVE METABOLIC PANEL - Abnormal; Notable for the following components:      Result Value   Potassium 5.6 (*)    Glucose, Bld 104 (*)    Calcium 8.2 (*)    Albumin 3.1 (*)    AST 347 (*)    ALT 83 (*)    Alkaline Phosphatase 429 (*)    All other components within normal limits  ETHANOL - Abnormal; Notable for the following components:   Alcohol, Ethyl (B) 307 (*)    All other components within normal limits  CBC - Abnormal; Notable for the following components:   Hemoglobin 11.7 (*)    HCT 34.3 (*)    RDW 17.0 (*)    All other components within normal limits  URINE DRUG SCREEN, QUALITATIVE ([facility] ONLY)      Procedures   ____________________________________________   INITIAL IMPRESSION / ASSESSMENT AND PLAN / ED COURSE  As part of my medical decision making, I reviewed the following data within  the electronic MEDICAL RECORD NUMBER   52-year-old female presenting with above-stated history and physical exam of alcohol intoxication involuntary committed secondary to concern for EtOH ingestion and prescription medication usage. ____________________________________________  FINAL CLINICAL IMPRESSION(S) / ED DIAGNOSES  Final diagnoses:  Alcoholic intoxication with complication (HCC)     MEDICATIONS GIVEN DURING THIS VISIT:  Medications - No data to display   ED Discharge Orders    None       Note:  This document was prepared using Dragon voice recognition software and may include unintentional dictation errors.    Darci Current, MD 11/26/17 [phone]

## 2017-11-25 NOTE — ED Notes (Signed)
Gray shorts, black tee shirt, purple underwear placed in belonging bag

## 2017-11-26 DIAGNOSIS — F1010 Alcohol abuse, uncomplicated: Secondary | ICD-10-CM

## 2017-11-26 MED ORDER — LORAZEPAM 2 MG/ML IJ SOLN: 0.0000 mg | Freq: Two times a day (BID) | INTRAMUSCULAR | Status: DC

## 2017-11-26 MED ORDER — VITAMIN B-1 100 MG PO TABS
100.0000 mg | ORAL_TABLET | Freq: Every day | ORAL | Status: DC
  Administered 2017-11-26: 100 mg via ORAL
  Filled 2017-11-26: qty 1

## 2017-11-26 MED ORDER — THIAMINE HCL 100 MG/ML IJ SOLN: 100.0000 mg | Freq: Every day | INTRAMUSCULAR | Status: DC

## 2017-11-26 MED ORDER — LORAZEPAM 1 MG PO TABS
ORAL_TABLET | ORAL | Status: AC
  Filled 2017-11-26: qty 1

## 2017-11-26 MED ORDER — LORAZEPAM 2 MG PO TABS: 0.0000 mg | ORAL_TABLET | Freq: Two times a day (BID) | ORAL | Status: DC

## 2017-11-26 MED ORDER — LORAZEPAM 2 MG PO TABS
0.0000 mg | ORAL_TABLET | Freq: Four times a day (QID) | ORAL | Status: DC
  Administered 2017-11-26 (×2): 1 mg via ORAL
  Filled 2017-11-26: qty 1

## 2017-11-26 MED ORDER — ONDANSETRON 4 MG PO TBDP
4.0000 mg | ORAL_TABLET | Freq: Once | ORAL | Status: AC
  Administered 2017-11-26: 4 mg via ORAL
  Filled 2017-11-26: qty 1

## 2017-11-26 MED ORDER — LORAZEPAM 2 MG/ML IJ SOLN: 0.0000 mg | Freq: Four times a day (QID) | INTRAMUSCULAR | Status: DC

## 2017-11-26 MED ORDER — KETOROLAC TROMETHAMINE 10 MG PO TABS
10.0000 mg | ORAL_TABLET | Freq: Once | ORAL | Status: AC
  Administered 2017-11-26: 10 mg via ORAL
  Filled 2017-11-26: qty 1

## 2017-11-26 NOTE — ED Notes (Signed)
Pt. Transferred to BHU from ED to room after screening for contraband. Report to include Situation, Background, Assessment and Recommendations from RN Tammy Sours. Pt. Oriented to unit including Q15 minute rounds as well as the security cameras for their protection. Patient is alert and oriented, warm and dry in no acute distress. Patient denies SI, HI, and AVH.

## 2017-11-26 NOTE — ED Notes (Signed)
Hourly rounding reveals patient in room. No complaints, stable, in no acute distress. Q15 minute rounds and monitoring via Security Cameras to continue. 

## 2017-11-26 NOTE — ED Notes (Signed)
Pt given lunch tray.
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
## 2017-11-26 NOTE — ED Provider Notes (Signed)
Patient has been cleared by psychiatry for discharge.   Joanna Filbert, MD 11/26/17 [phone]
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
## 2017-11-26 NOTE — ED Notes (Signed)
Patient c/o nausea and Migraine headache pain level 7. Notified MD.

## 2017-11-26 NOTE — ED Notes (Signed)
Hourly rounding reveals patient in room. No complaints, stable, in no acute distress. Q15 minute rounds and monitoring via Rover and Officer to continue.   

## 2017-11-26 NOTE — ED Notes (Signed)
Pt discharged home. VS stable. Discharge instructions reviewed with and signed by patient.  Pt denies SI/HI and AVH.  Pt's husband coming to pick up patient.

## 2018-04-22 ENCOUNTER — Encounter: Payer: Self-pay | Admitting: Emergency Medicine

## 2018-04-22 ENCOUNTER — Other Ambulatory Visit: Payer: Self-pay

## 2018-04-22 ENCOUNTER — Emergency Department

## 2018-04-22 ENCOUNTER — Inpatient Hospital Stay
Admission: EM | Admit: 2018-04-22 | Discharge: 2018-04-25 | DRG: 897 | Disposition: A | Discharge status: Home / Self Care | Attending: Internal Medicine | Admitting: Internal Medicine

## 2018-04-22 DIAGNOSIS — F419 Anxiety disorder, unspecified: Secondary | ICD-10-CM | POA: Diagnosis present

## 2018-04-22 DIAGNOSIS — E872 Acidosis: Secondary | ICD-10-CM | POA: Diagnosis present

## 2018-04-22 DIAGNOSIS — Z90710 Acquired absence of both cervix and uterus: Secondary | ICD-10-CM

## 2018-04-22 DIAGNOSIS — Z79899 Other long term (current) drug therapy: Secondary | ICD-10-CM | POA: Diagnosis not present

## 2018-04-22 DIAGNOSIS — F10229 Alcohol dependence with intoxication, unspecified: Secondary | ICD-10-CM | POA: Diagnosis present

## 2018-04-22 DIAGNOSIS — K7030 Alcoholic cirrhosis of liver without ascites: Secondary | ICD-10-CM | POA: Diagnosis present

## 2018-04-22 DIAGNOSIS — E860 Dehydration: Secondary | ICD-10-CM | POA: Diagnosis present

## 2018-04-22 DIAGNOSIS — E876 Hypokalemia: Secondary | ICD-10-CM | POA: Diagnosis present

## 2018-04-22 DIAGNOSIS — F10230 Alcohol dependence with withdrawal, uncomplicated: Secondary | ICD-10-CM | POA: Diagnosis present

## 2018-04-22 DIAGNOSIS — F10931 Alcohol use, unspecified with withdrawal delirium: Secondary | ICD-10-CM | POA: Diagnosis present

## 2018-04-22 DIAGNOSIS — Z86711 Personal history of pulmonary embolism: Secondary | ICD-10-CM | POA: Diagnosis not present

## 2018-04-22 DIAGNOSIS — G629 Polyneuropathy, unspecified: Secondary | ICD-10-CM | POA: Diagnosis present

## 2018-04-22 DIAGNOSIS — R0600 Dyspnea, unspecified: Secondary | ICD-10-CM

## 2018-04-22 DIAGNOSIS — E440 Moderate protein-calorie malnutrition: Secondary | ICD-10-CM | POA: Diagnosis present

## 2018-04-22 DIAGNOSIS — I110 Hypertensive heart disease with heart failure: Secondary | ICD-10-CM | POA: Diagnosis present

## 2018-04-22 DIAGNOSIS — R000 Tachycardia, unspecified: Secondary | ICD-10-CM | POA: Diagnosis present

## 2018-04-22 DIAGNOSIS — I5022 Chronic systolic (congestive) heart failure: Secondary | ICD-10-CM | POA: Diagnosis present

## 2018-04-22 DIAGNOSIS — D509 Iron deficiency anemia, unspecified: Secondary | ICD-10-CM | POA: Diagnosis present

## 2018-04-22 DIAGNOSIS — Z833 Family history of diabetes mellitus: Secondary | ICD-10-CM | POA: Diagnosis not present

## 2018-04-22 DIAGNOSIS — F10930 Alcohol use, unspecified with withdrawal, uncomplicated: Secondary | ICD-10-CM

## 2018-04-22 DIAGNOSIS — Z6820 Body mass index (BMI) 20.0-20.9, adult: Secondary | ICD-10-CM | POA: Diagnosis not present

## 2018-04-22 DIAGNOSIS — Z888 Allergy status to other drugs, medicaments and biological substances status: Secondary | ICD-10-CM

## 2018-04-22 DIAGNOSIS — F10231 Alcohol dependence with withdrawal delirium: Secondary | ICD-10-CM | POA: Diagnosis present

## 2018-04-22 LAB — BASIC METABOLIC PANEL
Anion gap: 18 — ABNORMAL HIGH (ref 5–15)
BUN: 14 mg/dL (ref 6–20)
CHLORIDE: 99 mmol/L (ref 98–111)
CO2: 21 mmol/L — AB (ref 22–32)
CREATININE: 0.54 mg/dL (ref 0.44–1.00)
Calcium: 9 mg/dL (ref 8.9–10.3)
GFR calc non Af Amer: >60 mL/min (ref >60)
Glucose, Bld: 150 mg/dL — ABNORMAL HIGH (ref 70–99)
POTASSIUM: 4.4 mmol/L (ref 3.5–5.1)
Sodium: 138 mmol/L (ref 135–145)

## 2018-04-22 LAB — URINALYSIS, COMPLETE (UACMP) WITH MICROSCOPIC
Bacteria, UA: NONE SEEN
Bilirubin Urine: NEGATIVE
GLUCOSE, UA: NEGATIVE mg/dL
Hgb urine dipstick: NEGATIVE
Ketones, ur: 5 mg/dL — AB
Leukocytes, UA: NEGATIVE
Nitrite: POSITIVE — AB
PH: 8 (ref 5.0–8.0)
Protein, ur: 100 mg/dL — AB
SPECIFIC GRAVITY, URINE: 1.021 (ref 1.005–1.030)

## 2018-04-22 LAB — URINE DRUG SCREEN, QUALITATIVE (ARMC ONLY)
Amphetamines, Ur Screen: NOT DETECTED
Barbiturates, Ur Screen: POSITIVE — AB
Benzodiazepine, Ur Scrn: NOT DETECTED
Cannabinoid 50 Ng, Ur ~~LOC~~: NOT DETECTED
Cocaine Metabolite,Ur ~~LOC~~: NOT DETECTED
MDMA (Ecstasy)Ur Screen: NOT DETECTED
Methadone Scn, Ur: NOT DETECTED
Opiate, Ur Screen: NOT DETECTED
Phencyclidine (PCP) Ur S: NOT DETECTED
TRICYCLIC, UR SCREEN: POSITIVE — AB

## 2018-04-22 LAB — PROTIME-INR
INR: 1.04
PROTHROMBIN TIME: 13.5 s (ref 11.4–15.2)

## 2018-04-22 LAB — CBC WITH DIFFERENTIAL/PLATELET
ABS IMMATURE GRANULOCYTES: 0.01 10*3/uL (ref 0.00–0.07)
BASOS ABS: 0.1 10*3/uL (ref 0.0–0.1)
Basophils Relative: 1 %
EOS PCT: 0 %
Eosinophils Absolute: 0 10*3/uL (ref 0.0–0.5)
HEMATOCRIT: 42.3 % (ref 36.0–46.0)
HEMOGLOBIN: 13.5 g/dL (ref 12.0–15.0)
Immature Granulocytes: 0 %
LYMPHS PCT: 32 %
Lymphs Abs: 2 10*3/uL (ref 0.7–4.0)
MCH: 27.3 pg (ref 26.0–34.0)
MCHC: 31.9 g/dL (ref 30.0–36.0)
MCV: 85.5 fL (ref 80.0–100.0)
Monocytes Absolute: 0.3 10*3/uL (ref 0.1–1.0)
Monocytes Relative: 5 %
NEUTROS ABS: 3.9 10*3/uL (ref 1.7–7.7)
NRBC: 0 % (ref 0.0–0.2)
Neutrophils Relative %: 62 %
Platelets: 208 10*3/uL (ref 150–400)
RBC: 4.95 MIL/uL (ref 3.87–5.11)
RDW: 16.7 % — ABNORMAL HIGH (ref 11.5–15.5)
WBC: 6.4 10*3/uL (ref 4.0–10.5)

## 2018-04-22 LAB — LACTIC ACID, PLASMA
LACTIC ACID, VENOUS: 1.8 mmol/L (ref 0.5–1.9)
LACTIC ACID, VENOUS: 0.8 mmol/L (ref 0.5–1.9)
Lactic Acid, Venous: 3.8 mmol/L (ref 0.5–1.9)
Lactic Acid, Venous: 1.1 mmol/L (ref 0.5–1.9)

## 2018-04-22 LAB — TROPONIN I

## 2018-04-22 LAB — ETHANOL

## 2018-04-22 LAB — FIBRIN DERIVATIVES D-DIMER (ARMC ONLY): FIBRIN DERIVATIVES D-DIMER (ARMC): 590.69 ng{FEU}/mL — AB (ref 0.00–499.00)

## 2018-04-22 LAB — BRAIN NATRIURETIC PEPTIDE: B Natriuretic Peptide: 27 pg/mL (ref 0.0–100.0)

## 2018-04-22 MED ORDER — PANTOPRAZOLE SODIUM 40 MG PO TBEC
40.0000 mg | DELAYED_RELEASE_TABLET | Freq: Two times a day (BID) | ORAL | Status: DC
  Administered 2018-04-22 – 2018-04-25 (×6): 40 mg via ORAL
  Filled 2018-04-22 (×6): qty 1

## 2018-04-22 MED ORDER — LORAZEPAM 2 MG PO TABS
0.0000 mg | ORAL_TABLET | Freq: Four times a day (QID) | ORAL | Status: DC
  Administered 2018-04-22: 2 mg via ORAL
  Administered 2018-04-23: 1 mg via ORAL
  Administered 2018-04-23: 2 mg via ORAL
  Filled 2018-04-22 (×4): qty 1

## 2018-04-22 MED ORDER — RIFAXIMIN 550 MG PO TABS
550.0000 mg | ORAL_TABLET | Freq: Two times a day (BID) | ORAL | Status: DC
  Administered 2018-04-22 – 2018-04-25 (×6): 550 mg via ORAL
  Filled 2018-04-22 (×7): qty 1

## 2018-04-22 MED ORDER — LORATADINE 10 MG PO TABS
10.0000 mg | ORAL_TABLET | Freq: Every day | ORAL | Status: DC
  Administered 2018-04-22 – 2018-04-25 (×4): 10 mg via ORAL
  Filled 2018-04-22 (×4): qty 1

## 2018-04-22 MED ORDER — CYCLOBENZAPRINE HCL 10 MG PO TABS
5.0000 mg | ORAL_TABLET | Freq: Three times a day (TID) | ORAL | Status: DC | PRN
  Administered 2018-04-22 – 2018-04-25 (×4): 5 mg via ORAL
  Filled 2018-04-22 (×2): qty 1
  Filled 2018-04-22: qty 0.5
  Filled 2018-04-22: qty 1

## 2018-04-22 MED ORDER — LORAZEPAM 2 MG/ML IJ SOLN
1.0000 mg | Freq: Once | INTRAMUSCULAR | Status: AC
  Administered 2018-04-22: 1 mg via INTRAVENOUS
  Filled 2018-04-22: qty 1

## 2018-04-22 MED ORDER — VITAMIN B-1 100 MG PO TABS
100.0000 mg | ORAL_TABLET | Freq: Every day | ORAL | Status: DC
  Administered 2018-04-22 – 2018-04-25 (×4): 100 mg via ORAL
  Filled 2018-04-22 (×4): qty 1

## 2018-04-22 MED ORDER — LORAZEPAM 2 MG/ML IJ SOLN
2.0000 mg | Freq: Once | INTRAMUSCULAR | Status: AC
  Administered 2018-04-22: 2 mg via INTRAVENOUS
  Filled 2018-04-22: qty 1

## 2018-04-22 MED ORDER — FOLIC ACID 1 MG PO TABS
1.0000 mg | ORAL_TABLET | Freq: Every day | ORAL | Status: DC
  Administered 2018-04-22 – 2018-04-25 (×4): 1 mg via ORAL
  Filled 2018-04-22 (×4): qty 1

## 2018-04-22 MED ORDER — SODIUM CHLORIDE 0.9 % IV BOLUS
500.0000 mL | Freq: Once | INTRAVENOUS | Status: AC
  Administered 2018-04-22: 500 mL via INTRAVENOUS

## 2018-04-22 MED ORDER — LORAZEPAM 1 MG PO TABS
1.0000 mg | ORAL_TABLET | Freq: Four times a day (QID) | ORAL | Status: AC | PRN
  Administered 2018-04-23 – 2018-04-25 (×4): 1 mg via ORAL
  Filled 2018-04-22: qty 1

## 2018-04-22 MED ORDER — SUCRALFATE 1 G PO TABS
1.0000 g | ORAL_TABLET | Freq: Four times a day (QID) | ORAL | Status: DC
  Administered 2018-04-22 – 2018-04-25 (×12): 1 g via ORAL
  Filled 2018-04-22 (×12): qty 1

## 2018-04-22 MED ORDER — QUETIAPINE FUMARATE 25 MG PO TABS
100.0000 mg | ORAL_TABLET | Freq: Every day | ORAL | Status: DC
  Administered 2018-04-22 – 2018-04-24 (×3): 100 mg via ORAL
  Filled 2018-04-22 (×3): qty 4

## 2018-04-22 MED ORDER — ADULT MULTIVITAMIN W/MINERALS CH
1.0000 | ORAL_TABLET | Freq: Every day | ORAL | Status: DC
  Administered 2018-04-24 – 2018-04-25 (×2): 1 via ORAL
  Filled 2018-04-22 (×2): qty 1

## 2018-04-22 MED ORDER — LORAZEPAM 2 MG PO TABS: 0.0000 mg | ORAL_TABLET | Freq: Two times a day (BID) | ORAL | Status: DC

## 2018-04-22 MED ORDER — GABAPENTIN 300 MG PO CAPS
600.0000 mg | ORAL_CAPSULE | Freq: Three times a day (TID) | ORAL | Status: DC
  Administered 2018-04-22 – 2018-04-25 (×9): 600 mg via ORAL
  Filled 2018-04-22 (×9): qty 2

## 2018-04-22 MED ORDER — SODIUM CHLORIDE 0.9 % IV SOLN
INTRAVENOUS | Status: DC
  Administered 2018-04-22: 16:00:00 via INTRAVENOUS

## 2018-04-22 MED ORDER — THIAMINE HCL 100 MG/ML IJ SOLN: 100.0000 mg | Freq: Every day | INTRAMUSCULAR | Status: DC

## 2018-04-22 MED ORDER — ONDANSETRON HCL 4 MG/2ML IJ SOLN
4.0000 mg | Freq: Once | INTRAMUSCULAR | Status: AC
  Administered 2018-04-22: 4 mg via INTRAVENOUS

## 2018-04-22 MED ORDER — CARVEDILOL 12.5 MG PO TABS
12.5000 mg | ORAL_TABLET | Freq: Two times a day (BID) | ORAL | Status: DC
  Administered 2018-04-22 – 2018-04-25 (×6): 12.5 mg via ORAL
  Filled 2018-04-22 (×6): qty 1

## 2018-04-22 MED ORDER — ENOXAPARIN SODIUM 40 MG/0.4ML ~~LOC~~ SOLN
40.0000 mg | SUBCUTANEOUS | Status: DC
  Administered 2018-04-22 – 2018-04-24 (×3): 40 mg via SUBCUTANEOUS
  Filled 2018-04-22 (×3): qty 0.4

## 2018-04-22 MED ORDER — LORAZEPAM 2 MG/ML IJ SOLN
INTRAMUSCULAR | Status: AC
  Filled 2018-04-22: qty 1

## 2018-04-22 MED ORDER — LORAZEPAM 2 MG/ML IJ SOLN
2.0000 mg | Freq: Once | INTRAMUSCULAR | Status: AC
  Administered 2018-04-22: 2 mg via INTRAVENOUS

## 2018-04-22 MED ORDER — LORAZEPAM 2 MG/ML IJ SOLN
1.0000 mg | Freq: Four times a day (QID) | INTRAMUSCULAR | Status: AC | PRN
  Administered 2018-04-22: 0.5 mg via INTRAVENOUS
  Administered 2018-04-24: 1 mg via INTRAVENOUS
  Filled 2018-04-22 (×3): qty 1

## 2018-04-22 MED ORDER — IOHEXOL 350 MG/ML SOLN
75.0000 mL | Freq: Once | INTRAVENOUS | Status: AC | PRN
  Administered 2018-04-22: 75 mL via INTRAVENOUS

## 2018-04-22 MED ORDER — MAGNESIUM OXIDE 400 (241.3 MG) MG PO TABS
400.0000 mg | ORAL_TABLET | Freq: Every day | ORAL | Status: DC
  Administered 2018-04-22 – 2018-04-25 (×4): 400 mg via ORAL
  Filled 2018-04-22 (×4): qty 1

## 2018-04-22 MED ORDER — DULOXETINE HCL 30 MG PO CPEP
120.0000 mg | ORAL_CAPSULE | Freq: Every day | ORAL | Status: DC
  Administered 2018-04-22 – 2018-04-25 (×4): 120 mg via ORAL
  Filled 2018-04-22 (×4): qty 4

## 2018-04-22 MED ORDER — BUTALBITAL-APAP-CAFFEINE 50-325-40 MG PO TABS
1.0000 | ORAL_TABLET | Freq: Two times a day (BID) | ORAL | Status: DC | PRN
  Administered 2018-04-22 – 2018-04-25 (×6): 1 via ORAL
  Filled 2018-04-22 (×6): qty 1

## 2018-04-22 MED ORDER — KETOTIFEN FUMARATE 0.025 % OP SOLN
1.0000 [drp] | Freq: Two times a day (BID) | OPHTHALMIC | Status: DC
  Administered 2018-04-22 – 2018-04-25 (×5): 1 [drp] via OPHTHALMIC
  Filled 2018-04-22 (×2): qty 5

## 2018-04-22 MED ORDER — ONDANSETRON HCL 4 MG/2ML IJ SOLN
INTRAMUSCULAR | Status: AC
  Filled 2018-04-22: qty 2

## 2018-04-22 NOTE — ED Notes (Signed)
Admission MD at bedside.  

## 2018-04-22 NOTE — ED Notes (Signed)
Pt appears noticeably more comfortable in bed. Less tremors noted at this time. Pt remains alert and oriented at this time. Pt currently receiving 500cc bolus. Will continue to monitor for further patient needs.

## 2018-04-22 NOTE — ED Notes (Signed)
Pt given another 2mg  Ativan per MD order due to HR being 120's. Pt appears more comfortable at this time, states she feels less shakey. Will continue to monitor for further patient needs.

## 2018-04-22 NOTE — ED Notes (Signed)
Patient transported to CT 

## 2018-04-22 NOTE — Progress Notes (Signed)
Family Meeting Note  Advance Directive:yes  Today a meeting took place with the Patient.    The following clinical team members were present during this meeting:MD  The following were discussed:Patient's diagnosis: Acute alcohol intoxication, with withdrawal, chronic congestive heart failure, neuropathy, alcoholic liver cirrhosis, essential hypertension, sinus tachycardia, given several fluid boluses and patient will be admitted to the hospital.  Monitor CIWA scoring treatment plan of care discussed in detail with the patient.  She verbalized understanding of the plan.    Patient's progosis: Unable to determine and Goals for treatment: Full Code  Additional follow-up to be provided: Hospitalist  Time spent during discussion:17 min  Ramonita Lab, MD

## 2018-04-22 NOTE — Progress Notes (Signed)
Pt's husband came out of room expressing concerns about pt's history of medication abuse, husband states that pt has OD on Fioricet in the past and that pt has some Fioricet in her purse. Per husband, pt had a tele-sitter and a 1:1 sitter during last admission because pt is noncompliant and kept using her home medications. Pt's husband states that pt is lying to this nurse; I asked pt during assessment with husband in room, if she had any medications in the room that she needed to send home or pharmacy, pt stated that she had couple of tylenols and that she was going to  send them home with husband. After talking to the pt's husband, I went to the room and asked pt again if there was any medications in her purse that she needed to send to pharmacy and she said she had tylenol. She gave me permission to search her bag Joanna Sanchez, NT in room) and she had Fioricet, Tylenol, Ibuprofen with pseudoephedrine Hcl, Zofran, gabapentin, and eye gtts. I will send medications to pharmacy.

## 2018-04-22 NOTE — ED Notes (Signed)
Awaiting fluid bolus completion prior to drawing lactic acid.

## 2018-04-22 NOTE — ED Notes (Signed)
ED TO INPATIENT HANDOFF REPORT  Name/Age/Gender Joanna Sanchez 53 y.o. female  Code Status Code Status History    Date Active Date Inactive Code Status Order ID Comments User Context   11/26/2017 0601 11/26/2017 1705 Full Code [id number]  Darci Current, MD ED   07/29/2017 1857 08/03/2017 1614 Full Code [id number]  Milagros Loll, MD ED      Home/SNF/Other Home  Chief Complaint sob ems  Level of Care/Admitting Diagnosis ED Disposition    ED Disposition Condition Comment   Admit  Hospital Area: [facility] [[id number]]  Level of Care: Telemetry [5]  Diagnosis: Alcohol withdrawal delirium (HCC) [291.0.ICD-9-CM]  Admitting Physician: Ramonita Lab [[id number]]  Attending Physician: Ramonita Lab [[id number]]  Estimated length of stay: 3 - 4 days  Certification:: I certify this patient will need inpatient services for at least 2 midnights  PT Class (Do Not Modify): Inpatient [101]  PT Acc Code (Do Not Modify): Private [1]       Medical History Past Medical History:  Diagnosis Date  . Alcohol abuse   . CHF (congestive heart failure) (HCC)   . Liver disease   . Neuropathy     Allergies Allergies  Allergen Reactions  . Eliquis [Apixaban] Anaphylaxis  . Niacin Anaphylaxis, Dermatitis and Shortness Of Breath  . Niacin And Related   . Other     Patient states that she had an anaphylactic reaction to a blood thinner but does not remember the name of it     IV Location/Drains/Wounds Patient Lines/Drains/Airways Status   Active Line/Drains/Airways    Name:   Placement date:   Placement time:   Site:   Days:   Peripheral IV 04/22/18 Left Forearm   04/22/18    1227    Forearm   less than 1          Labs/Imaging Results for orders placed or performed during the hospital encounter of 04/22/18 (from the past 48 hour(s))  Basic metabolic panel     Status: Abnormal   Collection Time: 04/22/18 10:52 AM  Result Value Ref Range   Sodium 138 135 - 145 mmol/L    Potassium 4.4 3.5 - 5.1 mmol/L   Chloride 99 98 - 111 mmol/L   CO2 21 (L) 22 - 32 mmol/L   Glucose, Bld 150 (H) 70 - 99 mg/dL   BUN 14 6 - 20 mg/dL   Creatinine, Ser 7.82 0.44 - 1.00 mg/dL   Calcium 9.0 8.9 - 95.6 mg/dL   GFR calc non Af Amer >60 >60 mL/min   GFR calc Af Amer >60 >60 mL/min   Anion gap 18 (H) 5 - 15    Comment: Performed at [facility], [address] [place], [place] [address]  CBC with Differential     Status: Abnormal   Collection Time: 04/22/18 10:52 AM  Result Value Ref Range   WBC 6.4 4.0 - 10.5 K/uL   RBC 4.95 3.87 - 5.11 MIL/uL   Hemoglobin 13.5 12.0 - 15.0 g/dL   HCT 65.7 84.6 - 96.2 %   MCV 85.5 80.0 - 100.0 fL   MCH 27.3 26.0 - 34.0 pg   MCHC 31.9 30.0 - 36.0 g/dL   RDW 95.2 (H) 84.1 - 32.4 %   Platelets 208 150 - 400 K/uL   nRBC 0.0 0.0 - 0.2 %   Neutrophils Relative % 62 %   Neutro Abs 3.9 1.7 - 7.7 K/uL   Lymphocytes Relative 32 %   Lymphs  Abs 2.0 0.7 - 4.0 K/uL   Monocytes Relative 5 %   Monocytes Absolute 0.3 0.1 - 1.0 K/uL   Eosinophils Relative 0 %   Eosinophils Absolute 0.0 0.0 - 0.5 K/uL   Basophils Relative 1 %   Basophils Absolute 0.1 0.0 - 0.1 K/uL   Immature Granulocytes 0 %   Abs Immature Granulocytes 0.01 0.00 - 0.07 K/uL    Comment: Performed at [facility], [address] [place], [place] [address]  Troponin I - Once     Status: None   Collection Time: 04/22/18 10:52 AM  Result Value Ref Range   Troponin I <0.03 <0.03 ng/mL    Comment: Performed at [facility], [address] [place], [place] [address]  Fibrin derivatives D-Dimer     Status: Abnormal   Collection Time: 04/22/18 10:52 AM  Result Value Ref Range   Fibrin derivatives D-dimer ([facility]) 590.69 (H) 0.00 - 499.00 ng/mL (FEU)    Comment: (NOTE) <> Exclusion of Venous Thromboembolism (VTE) - OUTPATIENT ONLY   (Emergency Department or [place])   0-499 ng/ml (FEU): With a low to intermediate pretest probability                      for  VTE this test result excludes the diagnosis                      of VTE.   >499 ng/ml (FEU) : VTE not excluded; additional work up for VTE is                      required. <> Testing on Inpatients and Evaluation of Disseminated Intravascular   Coagulation (DIC) Reference Range:   0-499 ng/ml (FEU) Performed at [facility], [address] [place], [place] [address]   Protime-INR     Status: None   Collection Time: 04/22/18 10:52 AM  Result Value Ref Range   Prothrombin Time 13.5 11.4 - 15.2 seconds   INR 1.04     Comment: Performed at [facility], [address] [place], [place] [address]  Brain natriuretic peptide     Status: None   Collection Time: 04/22/18 10:52 AM  Result Value Ref Range   B Natriuretic Peptide 27.0 0.0 - 100.0 pg/mL    Comment: Performed at [facility], [address] [place], [place] [address]  Ethanol     Status: None   Collection Time: 04/22/18 10:52 AM  Result Value Ref Range   Alcohol, Ethyl (B) <10 <10 mg/dL    Comment: (NOTE) Lowest detectable limit for serum alcohol is 10 mg/dL. For medical purposes only. Performed at [facility], [address] [place], [place] [address]   Lactic acid, plasma     Status: Abnormal   Collection Time: 04/22/18 10:56 AM  Result Value Ref Range   Lactic Acid, Venous 3.8 (HH) 0.5 - 1.9 mmol/L    Comment: CRITICAL RESULT CALLED TO, READ BACK BY AND VERIFIED WITH Keyoni Lapinski AT 1145 ON 04/22/2018 [facility]. Performed at [facility], [address] [place], [place] [address]   Urinalysis, Complete w Microscopic     Status: Abnormal   Collection Time: 04/22/18  2:20 PM  Result Value Ref Range   Color, Urine AMBER (A) YELLOW    Comment: BIOCHEMICALS MAY BE AFFECTED BY COLOR   APPearance CLEAR (A) CLEAR   Specific Gravity, Urine 1.021 1.005 - 1.030  pH 8.0 5.0 - 8.0   Glucose, UA NEGATIVE NEGATIVE mg/dL   Hgb urine dipstick NEGATIVE NEGATIVE   Bilirubin Urine  NEGATIVE NEGATIVE   Ketones, ur 5 (A) NEGATIVE mg/dL   Protein, ur 454 (A) NEGATIVE mg/dL   Nitrite POSITIVE (A) NEGATIVE   Leukocytes, UA NEGATIVE NEGATIVE   RBC / HPF 0-5 0 - 5 RBC/hpf   WBC, UA 0-5 0 - 5 WBC/hpf   Bacteria, UA NONE SEEN NONE SEEN   Squamous Epithelial / LPF 0-5 0 - 5   Mucus PRESENT     Comment: Performed at [facility], [address], [place], [place] [address]  Urine Drug Screen, Qualitative     Status: Abnormal   Collection Time: 04/22/18  2:20 PM  Result Value Ref Range   Tricyclic, Ur Screen POSITIVE (A) NONE DETECTED   Amphetamines, Ur Screen NONE DETECTED NONE DETECTED   MDMA (Ecstasy)Ur Screen NONE DETECTED NONE DETECTED   Cocaine Metabolite,Ur [place] NONE DETECTED NONE DETECTED   Opiate, Ur Screen NONE DETECTED NONE DETECTED   Phencyclidine (PCP) Ur S NONE DETECTED NONE DETECTED   Cannabinoid 50 Ng, Ur [place] NONE DETECTED NONE DETECTED   Barbiturates, Ur Screen POSITIVE (A) NONE DETECTED   Benzodiazepine, Ur Scrn NONE DETECTED NONE DETECTED   Methadone Scn, Ur NONE DETECTED NONE DETECTED    Comment: (NOTE) Tricyclics + metabolites, urine    Cutoff 1000 ng/mL Amphetamines + metabolites, urine  Cutoff 1000 ng/mL MDMA (Ecstasy), urine              Cutoff 500 ng/mL Cocaine Metabolite, urine          Cutoff 300 ng/mL Opiate + metabolites, urine        Cutoff 300 ng/mL Phencyclidine (PCP), urine         Cutoff 25 ng/mL Cannabinoid, urine                 Cutoff 50 ng/mL Barbiturates + metabolites, urine  Cutoff 200 ng/mL Benzodiazepine, urine              Cutoff 200 ng/mL Methadone, urine                   Cutoff 300 ng/mL The urine drug screen provides only a preliminary, unconfirmed analytical test result and should not be used for non-medical purposes. Clinical consideration and professional judgment should be applied to any positive drug screen result due to possible interfering substances. A more specific alternate chemical method must be used  in order to obtain a confirmed analytical result. Gas chromatography / mass spectrometry (GC/MS) is the preferred confirmat ory method. Performed at [facility], [address] [place], [place] [address]   Lactic acid, plasma     Status: None   Collection Time: 04/22/18  2:21 PM  Result Value Ref Range   Lactic Acid, Venous 1.8 0.5 - 1.9 mmol/L    Comment: Performed at [facility], [address] [place], [place] [address]   Ct Angio Chest Pe W And/or Wo Contrast  Result Date: 04/22/2018 CLINICAL DATA:  Shortness of breath for several hours EXAM: CT ANGIOGRAPHY CHEST WITH CONTRAST TECHNIQUE: Multidetector CT imaging of the chest was performed using the standard protocol during bolus administration of intravenous contrast. Multiplanar CT image reconstructions and MIPs were obtained to evaluate the vascular anatomy. CONTRAST:  75mL OMNIPAQUE IOHEXOL 350 MG/ML SOLN COMPARISON:  Plain film from earlier in the same day. FINDINGS: Cardiovascular: Thoracic aorta and its  branches demonstrate minimal calcifications. No dissection or aneurysmal dilatation is noted. No coronary calcifications are noted. The pulmonary artery shows a normal branching pattern. No filling defects to suggest pulmonary emboli are noted. Mediastinum/Nodes: Thoracic inlet is within normal limits. No hilar or mediastinal adenopathy is noted. The esophagus is within normal limits. Lungs/Pleura: Lungs are well aerated bilaterally without focal confluent infiltrate or sizable effusion. No parenchymal nodules are seen. Some minimal scarring is noted in the apices bilaterally. Upper Abdomen: Visualized upper abdomen shows changes of gastric and gallbladder surgery. No acute abnormality noted. Musculoskeletal: Mild scoliosis concave to the right is noted. No acute bony abnormality is seen. Review of the MIP images confirms the above findings. IMPRESSION: No evidence of pulmonary emboli. No acute abnormality seen. Aortic  Atherosclerosis (ICD10-I70.0). Electronically Signed   By: Mark  LukenAlcide Clevers M.D.   On: 04/22/2018 14:13   Dg Chest Portable 1 View  Result Date: 04/22/2018 CLINICAL DATA:  Shortness of breath. EXAM: PORTABLE CHEST 1 VIEW COMPARISON:  Chest x-ray dated Jul 29, 2017. FINDINGS: The heart size and mediastinal contours are within normal limits. Normal pulmonary vascularity. No focal consolidation, pleural effusion, or pneumothorax. No acute osseous abnormality. IMPRESSION: No active disease. Electronically Signed   By: Obie DredgeWilliam T Derry M.D.   On: 04/22/2018 11:29    Pending Labs Unresulted Labs (From admission, onward)    Start     Ordered   04/22/18 1449  Lactic acid, plasma  STAT Now then every 3 hours,   STAT     04/22/18 1448          Vitals/Pain Today's Vitals   04/22/18 1132 04/22/18 1141 04/22/18 1235 04/22/18 1424  BP: (!) 154/100 115/85 (!) 120/96 (!) 152/97  Pulse: (!) 102 98 (!) 118 (!) 116  Resp:  14 (!) 28 (!) 23  Temp:      TempSrc:      SpO2:  98% 96% 100%  Weight:      Height:      PainSc:        Isolation Precautions No active isolations  Medications Medications  0.9 %  sodium chloride infusion (has no administration in time range)  LORazepam (ATIVAN) injection 1 mg (1 mg Intravenous Given 04/22/18 1120)  ondansetron (ZOFRAN) injection 4 mg (4 mg Intravenous Given 04/22/18 1125)  LORazepam (ATIVAN) injection 2 mg (2 mg Intravenous Given 04/22/18 1132)  sodium chloride 0.9 % bolus 500 mL (0 mLs Intravenous Stopped 04/22/18 1258)  LORazepam (ATIVAN) injection 1 mg (1 mg Intravenous Given 04/22/18 1213)  sodium chloride 0.9 % bolus 500 mL (0 mLs Intravenous Stopped 04/22/18 1451)  LORazepam (ATIVAN) injection 2 mg (2 mg Intravenous Given 04/22/18 1326)  iohexol (OMNIPAQUE) 350 MG/ML injection 75 mL (75 mLs Intravenous Contrast Given 04/22/18 1353)

## 2018-04-22 NOTE — ED Notes (Signed)
Date and time results received: 04/22/18 11:44 AM  (use smartphrase ".now" to insert current time)  Test:lactc acid Critical Value: 3.8  Name of Provider Notified: Sidecki  Orders Received? Or Actions Taken?: Actions Taken: this RN notified EDP of lactic acid 3.8

## 2018-04-22 NOTE — ED Notes (Signed)
Pt was able to ambulate to toilet w/1 person assistant.

## 2018-04-22 NOTE — H&P (Signed)
[facility] [place] [facility]   PATIENT NAME: Joanna Sanchez    MR#:  [id number]  DATE OF BIRTH:  1965-07-24  DATE OF ADMISSION:  04/22/2018  PRIMARY CARE PHYSICIAN: [facility] [place] [facility]   REQUESTING/REFERRING PHYSICIAN:   CHIEF COMPLAINT:   Anxiety HISTORY OF PRESENT ILLNESS:  Joanna Apemanda Holdren  is a 53 y.o. female with a known history of alcohol abuse, alcoholic liver cirrhosis, neuropathy, chronic congestive heart failure is presenting to the ED with a chief complaint of anxiety associated with the shakes of her extremities.  Patient admits to drinking alcohol heavily and she takes wine 3-4 bottles every day and last intake was yesterday night.  Initial CIWA score was 23 and eventually after hydrating with IV fluid boluses it went down to 13.  But patient was still shaky and unsteady.  CT angiogram of the chest is negative for pulmonary embolism and hospitalist team is called admit the patient.  Patient was tachycardic still  PAST MEDICAL HISTORY:   Past Medical History:  Diagnosis Date  . Alcohol abuse   . CHF (congestive heart failure) (HCC)   . Liver disease   . Neuropathy     PAST SURGICAL HISTOIRY:   Past Surgical History:  Procedure Laterality Date  . ABDOMINAL HYSTERECTOMY    . BREAST LUMPECTOMY Right   . TONSILLECTOMY      SOCIAL HISTORY:   Social History   Tobacco Use  . Smoking status: Never Smoker  . Smokeless tobacco: Never Used  Substance Use Topics  . Alcohol use: Yes    FAMILY HISTORY:   Family History  Problem Relation Age of Onset  . Diabetes Father     DRUG ALLERGIES:   Allergies  Allergen Reactions  . Eliquis [Apixaban] Anaphylaxis  . Niacin Anaphylaxis, Dermatitis and Shortness Of Breath  . Niacin And Related   . Other     Patient states that she had an anaphylactic reaction to a blood thinner but does not remember the name of it     REVIEW OF SYSTEMS:  CONSTITUTIONAL: No fever, fatigue or weakness.   EYES: No blurred or double vision.  EARS, NOSE, AND THROAT: No tinnitus or ear pain.  RESPIRATORY: No cough, shortness of breath, wheezing or hemoptysis.  CARDIOVASCULAR: No chest pain, orthopnea, edema.  GASTROINTESTINAL: No nausea, vomiting, diarrhea or abdominal pain.  GENITOURINARY: No dysuria, hematuria.  ENDOCRINE: No polyuria, nocturia,  HEMATOLOGY: No anemia, easy bruising or bleeding SKIN: No rash or lesion. MUSCULOSKELETAL: No joint pain or arthritis.   NEUROLOGIC: No tingling, numbness, weakness.  PSYCHIATRY: Patient is very anxious denies any depression   MEDICATIONS AT HOME:   Prior to Admission medications   Medication Sig Start Date End Date Taking? Authorizing Provider  azelastine (OPTIVAR) 0.05 % ophthalmic solution Place 1 drop into both eyes daily. 11/22/17  Yes [provider]  butalbital-acetaminophen-caffeine (FIORICET, ESGIC) 50-325-40 MG tablet Take 1 tablet by mouth 2 (two) times daily as needed for headache. 04/21/18  Yes [provider]  carvedilol (COREG) 12.5 MG tablet Take 12.5 mg by mouth 2 (two) times daily with a meal.   Yes [provider]  cetirizine (ZYRTEC) 10 MG tablet Take 10 mg by mouth daily. 02/23/18  Yes [provider]  cyclobenzaprine (FLEXERIL) 5 MG tablet Take 5 mg by mouth 3 (three) times daily as needed for muscle spasms. 03/29/18  Yes [provider]  DULoxetine (CYMBALTA) 60 MG capsule Take 120 mg by mouth daily.  Yes [provider]  furosemide (LASIX) 40 MG tablet Take 40 mg by mouth daily.   Yes [provider]  gabapentin (NEURONTIN) 300 MG capsule Take 600 mg by mouth 3 (three) times daily. 07/11/17 07/12/18 Yes [provider]  magnesium oxide (MAGOX 400) 400 (241.3 Mg) MG tablet Take 400 mg by mouth daily.   Yes [provider]  Multiple Vitamins-Minerals (CENTRUM ULTRA WOMENS) TABS Take 1 tablet by mouth daily.   Yes [provider]  pantoprazole  (PROTONIX) 40 MG tablet Take 1 tablet by mouth 2 (two) times daily. 05/09/17  Yes [provider]  potassium chloride SA (K-DUR,KLOR-CON) 20 MEQ tablet Take 1 tablet by mouth daily. 11/22/17  Yes [provider]  QUEtiapine (SEROQUEL) 100 MG tablet Take 100 mg by mouth at bedtime.   Yes [provider]  rifaximin (XIFAXAN) 550 MG TABS tablet Take 550 mg by mouth 2 (two) times daily.   Yes [provider]  sucralfate (CARAFATE) 1 g tablet Take 1 tablet by mouth 4 (four) times daily. 07/03/17  Yes [provider]  thiamine 100 MG tablet Take 100 mg by mouth daily.   Yes [provider]      VITAL SIGNS:  Blood pressure (!) 152/97, pulse (!) 116, temperature 97.9 F (36.6 C), temperature source Oral, resp. rate (!) 23, height 5\' 4"  (1.626 m), weight 53.5 kg, SpO2 100 %.  PHYSICAL EXAMINATION:  GENERAL:  53 y.o.-year-old patient lying in the bed with no acute distress.  EYES: Pupils equal, round, reactive to light and accommodation. No scleral icterus. Extraocular muscles intact.  HEENT: Head atraumatic, normocephalic. Oropharynx and nasopharynx clear.  NECK:  Supple, no jugular venous distention. No thyroid enlargement, no tenderness.  LUNGS: Normal breath sounds bilaterally, no wheezing, rales,rhonchi or crepitation. No use of accessory muscles of respiration.  CARDIOVASCULAR: S1, S2 normal. No murmurs, rubs, or gallops.  ABDOMEN: Soft, nontender, nondistended. Bowel sounds present.  EXTREMITIES: No pedal edema, cyanosis, or clubbing.  NEUROLOGIC: Awake, alert and oriented x3 sensation intact. Gait not checked.  Tremors are present PSYCHIATRIC: The patient is alert and oriented x 3.  SKIN: No obvious rash, lesion, or ulcer.   LABORATORY PANEL:   CBC Recent Labs  Lab 04/22/18 1052  WBC 6.4  HGB 13.5  HCT 42.3  PLT 208    ------------------------------------------------------------------------------------------------------------------  Chemistries  Recent Labs  Lab 04/22/18 1052  NA 138  K 4.4  CL 99  CO2 21*  GLUCOSE 150*  BUN 14  CREATININE 0.54  CALCIUM 9.0   ------------------------------------------------------------------------------------------------------------------  Cardiac Enzymes Recent Labs  Lab 04/22/18 1052  TROPONINI <0.03   ------------------------------------------------------------------------------------------------------------------  RADIOLOGY:  Ct Angio Chest Pe W And/or Wo Contrast  Result Date: 04/22/2018 CLINICAL DATA:  Shortness of breath for several hours EXAM: CT ANGIOGRAPHY CHEST WITH CONTRAST TECHNIQUE: Multidetector CT imaging of the chest was performed using the standard protocol during bolus administration of intravenous contrast. Multiplanar CT image reconstructions and MIPs were obtained to evaluate the vascular anatomy. CONTRAST:  75mL OMNIPAQUE IOHEXOL 350 MG/ML SOLN COMPARISON:  Plain film from earlier in the same day. FINDINGS: Cardiovascular: Thoracic aorta and its branches demonstrate minimal calcifications. No dissection or aneurysmal dilatation is noted. No coronary calcifications are noted. The pulmonary artery shows a normal branching pattern. No filling defects to suggest pulmonary emboli are noted. Mediastinum/Nodes: Thoracic inlet is within normal limits. No hilar or mediastinal adenopathy is noted. The esophagus is within normal limits. Lungs/Pleura: Lungs are well aerated bilaterally  without focal confluent infiltrate or sizable effusion. No parenchymal nodules are seen. Some minimal scarring is noted in the apices bilaterally. Upper Abdomen: Visualized upper abdomen shows changes of gastric and gallbladder surgery. No acute abnormality noted. Musculoskeletal: Mild scoliosis concave to the right is noted. No acute bony abnormality is seen. Review of the  MIP images confirms the above findings. IMPRESSION: No evidence of pulmonary emboli. No acute abnormality seen. Aortic Atherosclerosis (ICD10-I70.0). Electronically Signed   By: Alcide Clever M.D.   On: 04/22/2018 14:13   Dg Chest Portable 1 View  Result Date: 04/22/2018 CLINICAL DATA:  Shortness of breath. EXAM: PORTABLE CHEST 1 VIEW COMPARISON:  Chest x-ray dated Jul 29, 2017. FINDINGS: The heart size and mediastinal contours are within normal limits. Normal pulmonary vascularity. No focal consolidation, pleural effusion, or pneumothorax. No acute osseous abnormality. IMPRESSION: No active disease. Electronically Signed   By: Obie Dredge M.D.   On: 04/22/2018 11:29    EKG:   Orders placed or performed during the hospital encounter of 04/22/18  . ED EKG  . ED EKG  . EKG 12-Lead  . EKG 12-Lead    IMPRESSION AND PLAN:    #Acute alcohol withdrawal Admit patient to telemetry CIWA protocol Ativan as needed Multivitamin, thiamine and folate We will start the patient on Precedex if needed Hydrate with IV fluids with close monitoring for symptoms and signs of fluid overload  #Metabolic acidosis from alcohol intoxication Hydrate with IV fluids and monitor lactic acid  #Liver cirrhosis-from alcohol abuse Patient is mentating fine Outpatient follow-up with AA  continue home medication Xifaxan Holding Lasix with potassium supplements at this time  #Chronic neuropathy continue Neurontin  #Essential hypertension blood pressure stable continue home medications  All the records are reviewed and case discussed with ED provider. Management plans discussed with the patient, family and they are in agreement.  CODE STATUS: fc   TOTAL TIME TAKING CARE OF THIS PATIENT: 43  minutes.   Note: This dictation was prepared with Dragon dictation along with smaller phrase technology. Any transcriptional errors that result from this process are unintentional.  Ramonita Lab M.D on 04/22/2018 at 2:48  PM  Between 7am to 6pm - Pager - [phone]  After 6pm go to [email or web address] - password [email or web address] [facility]  [facility]  Office  [phone]  CC: Primary care physician; [facility] [place] [facility]

## 2018-04-22 NOTE — ED Triage Notes (Signed)
Pt presents to ED via [facility] with c/o SOB that started this morning when she was attempting to take a deep breath. Per EMS pt with hx of anxiety and CHF, lung sounds clear upon auscultation per EMS. Pt noted to be tachypneic on arrival to ED and shaking, pt appears very anxious at this time.

## 2018-04-22 NOTE — ED Provider Notes (Signed)
[facility] Emergency Department Provider Note ____________________________________________   First MD Initiated Contact with Patient 04/22/18 1058     (approximate)  I have reviewed the triage vital signs and the nursing notes.   HISTORY  Chief Complaint Shortness of Breath  Level 5 caveat: History of present illness limited due to acute anxiety and shortness of breath  HPI Joanna Sanchez is a 53 y.o. female with PMH as noted below who presents with shortness of breath, acute onset this morning, and associated with anxiety.  The patient states she has crampy pain in both legs but denies any chest pain.  Past Medical History:  Diagnosis Date  . Alcohol abuse   . CHF (congestive heart failure) (HCC)   . Liver disease   . Neuropathy     Patient Active Problem List   Diagnosis Date Noted  . Alcohol withdrawal delirium (HCC) 08/01/2017  . Hepatic cirrhosis (HCC) 08/01/2017  . Alcohol abuse 08/01/2017  . CHF (congestive heart failure) (HCC) 07/29/2017    Past Surgical History:  Procedure Laterality Date  . ABDOMINAL HYSTERECTOMY    . BREAST LUMPECTOMY Right   . TONSILLECTOMY      Prior to Admission medications   Medication Sig Start Date End Date Taking? Authorizing Provider  azelastine (OPTIVAR) 0.05 % ophthalmic solution Place 1 drop into both eyes daily. 11/22/17  Yes [provider]  butalbital-acetaminophen-caffeine (FIORICET, ESGIC) 50-325-40 MG tablet Take 1 tablet by mouth 2 (two) times daily as needed for headache. 04/21/18  Yes [provider]  carvedilol (COREG) 12.5 MG tablet Take 12.5 mg by mouth 2 (two) times daily with a meal.   Yes [provider]  cetirizine (ZYRTEC) 10 MG tablet Take 10 mg by mouth daily. 02/23/18  Yes [provider]  cyclobenzaprine (FLEXERIL) 5 MG tablet Take 5 mg by mouth 3 (three) times daily as needed for muscle spasms. 03/29/18  Yes [provider]  DULoxetine  (CYMBALTA) 60 MG capsule Take 120 mg by mouth daily.    Yes [provider]  furosemide (LASIX) 40 MG tablet Take 40 mg by mouth daily.   Yes [provider]  gabapentin (NEURONTIN) 300 MG capsule Take 600 mg by mouth 3 (three) times daily. 07/11/17 07/12/18 Yes [provider]  magnesium oxide (MAGOX 400) 400 (241.3 Mg) MG tablet Take 400 mg by mouth daily.   Yes [provider]  Multiple Vitamins-Minerals (CENTRUM ULTRA WOMENS) TABS Take 1 tablet by mouth daily.   Yes [provider]  pantoprazole (PROTONIX) 40 MG tablet Take 1 tablet by mouth 2 (two) times daily. 05/09/17  Yes [provider]  potassium chloride SA (K-DUR,KLOR-CON) 20 MEQ tablet Take 1 tablet by mouth daily. 11/22/17  Yes [provider]  QUEtiapine (SEROQUEL) 100 MG tablet Take 100 mg by mouth at bedtime.   Yes [provider]  rifaximin (XIFAXAN) 550 MG TABS tablet Take 550 mg by mouth 2 (two) times daily.   Yes [provider]  sucralfate (CARAFATE) 1 g tablet Take 1 tablet by mouth 4 (four) times daily. 07/03/17  Yes [provider]  thiamine 100 MG tablet Take 100 mg by mouth daily.   Yes [provider]    Allergies Eliquis [apixaban]; Niacin; Niacin and related; and Other  Family History  Problem Relation Age of Onset  . Diabetes Father     Social History Social History   Tobacco Use  . Smoking status: Never Smoker  . Smokeless  tobacco: Never Used  Substance Use Topics  . Alcohol use: Yes  . Drug use: No    Review of Systems Level 5 caveat: Unable to obtain review of systems due to anxiety and shortness of breath   ____________________________________________   PHYSICAL EXAM:  VITAL SIGNS: ED Triage Vitals  Enc Vitals Group     BP --      Pulse Rate 04/22/18 1049 100     Resp 04/22/18 1049 (!) 35     Temp 04/22/18 1049 97.9 F (36.6 C)     Temp Source 04/22/18 1049 Oral     SpO2 04/22/18 1045 100 %      Weight 04/22/18 1047 118 lb (53.5 kg)     Height 04/22/18 1047 5\' 4"  (1.626 m)     Head Circumference --      Peak Flow --      Pain Score 04/22/18 1047 10     Pain Loc --      Pain Edu? --      Excl. in GC? --     Constitutional: Alert, extremely anxious and uncomfortable appearing, trembling. Eyes: Conjunctivae are normal.  Head: Atraumatic. Nose: No congestion/rhinnorhea. Mouth/Throat: Mucous membranes are dry.   Neck: Normal range of motion.  Cardiovascular: Tachycardic, regular rhythm. Grossly normal heart sounds.  Good peripheral circulation. Respiratory: Increased respiratory effort.  Lungs CTAB. Gastrointestinal: No distention.  Musculoskeletal: No lower extremity edema.  Extremities warm and well perfused.  Neurologic:  Normal speech and language. No gross focal neurologic deficits are appreciated.  Skin:  Skin is warm and dry. No rash noted. Psychiatric: Anxious appearing.  ____________________________________________   LABS (all labs ordered are listed, but only abnormal results are displayed)  Labs Reviewed  BASIC METABOLIC PANEL - Abnormal; Notable for the following components:      Result Value   CO2 21 (*)    Glucose, Bld 150 (*)    Anion gap 18 (*)    All other components within normal limits  CBC WITH DIFFERENTIAL/PLATELET - Abnormal; Notable for the following components:   RDW 16.7 (*)    All other components within normal limits  FIBRIN DERIVATIVES D-DIMER ([facility] ONLY) - Abnormal; Notable for the following components:   Fibrin derivatives D-dimer ([facility]) 590.69 (*)    All other components within normal limits  LACTIC ACID, PLASMA - Abnormal; Notable for the following components:   Lactic Acid, Venous 3.8 (*)    All other components within normal limits  TROPONIN I  PROTIME-INR  BRAIN NATRIURETIC PEPTIDE  ETHANOL  URINALYSIS, COMPLETE (UACMP) WITH MICROSCOPIC  LACTIC ACID, PLASMA  URINE DRUG SCREEN, QUALITATIVE ([facility] ONLY)  LACTIC ACID, PLASMA    LACTIC ACID, PLASMA   ____________________________________________  EKG  ED ECG REPORT I, Dionne BucySebastian Jolleen Seman, the attending physician, personally viewed and interpreted this ECG.  Date: 04/22/2018 EKG Time: 1137 Rate: 97 Rhythm: normal sinus rhythm QRS Axis: Left axis deviation Intervals: Intraventricular conduction delay, prolonged QT ST/T Wave abnormalities: Nonspecific abnormalities, difficult to interpret due to poor baseline Narrative Interpretation: no evidence of acute ischemia; no significant change when compared to EKG of 08/04/2017  ____________________________________________  RADIOLOGY  CXR: No focal infiltrate or other acute abnormality CT chest: No acute PE ____________________________________________   PROCEDURES  Procedure(s) performed: No  Procedures  Critical Care performed: Yes  CRITICAL CARE Performed by: Dionne BucySebastian Emanie Behan   Total critical care time: 35 minutes  Critical care time was exclusive of separately billable procedures and treating other patients.  Critical  care was necessary to treat or prevent imminent or life-threatening deterioration.  Critical care was time spent personally by me on the following activities: development of treatment plan with patient and/or surrogate as well as nursing, discussions with consultants, evaluation of patient's response to treatment, examination of patient, obtaining history from patient or surrogate, ordering and performing treatments and interventions, ordering and review of laboratory studies, ordering and review of radiographic studies, pulse oximetry and re-evaluation of patient's condition. ____________________________________________   INITIAL IMPRESSION / ASSESSMENT AND PLAN / ED COURSE  Pertinent labs & imaging results that were available during my care of the patient were reviewed by me and considered in my medical decision making (see chart for details).  53 year old female with PMH as  noted above including CHF as well as alcohol abuse presents with acute onset of shortness of breath today.  I reviewed the past medical records in Epic; the patient was last seen in the ED in September of last year under involuntary commitment for alcohol abuse, and was admitted in May of last year for alcohol withdrawal.  Per EMS, the patient also apparently has a history of a PE although I do not see this documented anywhere in the chart.  On exam the patient is extremely anxious and uncomfortable appearing, sitting up in the bed, trembling.  She is tachypneic and slightly tachycardic but afebrile.  Her lungs are clear on exam.  The remainder of the exam is as described above.  EKG is nonischemic.  Differential includes CHF exacerbation, acute bronchitis, or other acute respiratory cause, versus PE, or alcohol withdrawal.  Given the patient's acute anxiety which appears to be contributing to her symptoms and the possibility of alcohol withdrawal, I will give IV Ativan.  We will obtain chest x-ray, lab work-up including troponin and d-dimer, and reassess.  ----------------------------------------- 3:17 PM on 04/22/2018 -----------------------------------------  On further history it turns out that the patient's last drink was yesterday evening at around 9 PM.  She did not have significant improvement with the initial Ativan, but additional Ativan was given and the patient appears more comfortable with some improvement in her heart rate.  She still has tremor and tongue fasciculation.  Overall presentation is most consistent with alcohol withdrawal.  Chest x-ray showed no acute findings, but the patient's d-dimer was elevated.  Obtained a CT chest which was negative.  Patient required admission for alcohol withdrawal.  I signed her out to the hospitalist Dr. Allena Katz. ____________________________________________   FINAL CLINICAL IMPRESSION(S) / ED DIAGNOSES  Final diagnoses:  Alcohol withdrawal  syndrome without complication (HCC)  Dyspnea, unspecified type      NEW MEDICATIONS STARTED DURING THIS VISIT:  New Prescriptions   No medications on file     Note:  This document was prepared using Dragon voice recognition software and may include unintentional dictation errors.    Dionne Bucy, MD 04/22/18 1517

## 2018-04-22 NOTE — ED Notes (Signed)
Pt states is a daily drinker of wine, states last glass approx 2100 yesterday.

## 2018-04-23 DIAGNOSIS — E440 Moderate protein-calorie malnutrition: Secondary | ICD-10-CM

## 2018-04-23 MED ORDER — LIDOCAINE 5 % EX PTCH
1.0000 | MEDICATED_PATCH | CUTANEOUS | Status: DC
  Administered 2018-04-23 – 2018-04-24 (×2): 1 via TRANSDERMAL
  Filled 2018-04-23 (×3): qty 1

## 2018-04-23 MED ORDER — ENSURE MAX PROTEIN PO LIQD
11.0000 [oz_av] | Freq: Every day | ORAL | Status: DC
  Administered 2018-04-23: 11 [oz_av] via ORAL
  Filled 2018-04-23: qty 330

## 2018-04-23 MED ORDER — LORAZEPAM 2 MG PO TABS
0.0000 mg | ORAL_TABLET | Freq: Four times a day (QID) | ORAL | Status: AC
  Administered 2018-04-24: 2 mg via ORAL
  Filled 2018-04-23 (×3): qty 1

## 2018-04-23 MED ORDER — LORAZEPAM 2 MG PO TABS
0.0000 mg | ORAL_TABLET | Freq: Two times a day (BID) | ORAL | Status: DC
  Administered 2018-04-24: 2 mg via ORAL
  Filled 2018-04-23: qty 1

## 2018-04-23 MED ORDER — LISINOPRIL 10 MG PO TABS
10.0000 mg | ORAL_TABLET | Freq: Every day | ORAL | Status: DC
  Administered 2018-04-23 – 2018-04-25 (×3): 10 mg via ORAL
  Filled 2018-04-23 (×3): qty 1

## 2018-04-23 NOTE — Progress Notes (Signed)
[facility] [place] [facility]   PATIENT NAME: Joanna Sanchez    MR#:  [id number]  DATE OF BIRTH:  1965-07-30  SUBJECTIVE:  CHIEF COMPLAINT:   Chief Complaint  Patient presents with  . Shortness of Breath  Patient seen today No complaints of chest pain No nausea Has some tremors in the upper extremity  REVIEW OF SYSTEMS:    ROS  CONSTITUTIONAL: No documented fever. Has fatigue, weakness. No weight gain, no weight loss.  EYES: No blurry or double vision.  ENT: No tinnitus. No postnasal drip. No redness of the oropharynx.  RESPIRATORY: No cough, no wheeze, no hemoptysis. No dyspnea.  CARDIOVASCULAR: No chest pain. No orthopnea. No palpitations. No syncope.  GASTROINTESTINAL: No nausea, no vomiting or diarrhea. No abdominal pain. No melena or hematochezia.  GENITOURINARY: No dysuria or hematuria.  ENDOCRINE: No polyuria or nocturia. No heat or cold intolerance.  HEMATOLOGY: No anemia. No bruising. No bleeding.  INTEGUMENTARY: No rashes. No lesions.  MUSCULOSKELETAL: No arthritis. No swelling. No gout.  NEUROLOGIC: No numbness, tingling, or ataxia. No seizure-type activity.  PSYCHIATRIC: No anxiety. No insomnia. No ADD.   DRUG ALLERGIES:   Allergies  Allergen Reactions  . Eliquis [Apixaban] Anaphylaxis  . Niacin Anaphylaxis, Dermatitis and Shortness Of Breath  . Niacin And Related   . Other     Patient states that she had an anaphylactic reaction to a blood thinner but does not remember the name of it     VITALS:  Blood pressure (!) 153/90, pulse (!) 102, temperature 98.9 F (37.2 C), temperature source Oral, resp. rate 16, height 5\' 4"  (1.626 m), weight 53.7 kg, SpO2 98 %.  PHYSICAL EXAMINATION:   Physical Exam  GENERAL:  53 y.o.-year-old patient lying in the bed with no acute distress.  EYES: Pupils equal, round, reactive to light and accommodation. No scleral icterus. Extraocular muscles intact.  HEENT: Head atraumatic, normocephalic.  Oropharynx and nasopharynx clear.  NECK:  Supple, no jugular venous distention. No thyroid enlargement, no tenderness.  LUNGS: Normal breath sounds bilaterally, no wheezing, rales, rhonchi. No use of accessory muscles of respiration.  CARDIOVASCULAR: S1, S2 tachycardia noted. No murmurs, rubs, or gallops.  ABDOMEN: Soft, nontender, nondistended. Bowel sounds present. No organomegaly or mass.  EXTREMITIES: No cyanosis, clubbing or edema b/l.    NEUROLOGIC: Cranial nerves II through XII are intact. No focal Motor or sensory deficits b/l.   PSYCHIATRIC: The patient is alert and oriented x 3.  SKIN: No obvious rash, lesion, or ulcer.   LABORATORY PANEL:   CBC Recent Labs  Lab 04/22/18 1052  WBC 6.4  HGB 13.5  HCT 42.3  PLT 208   ------------------------------------------------------------------------------------------------------------------ Chemistries  Recent Labs  Lab 04/22/18 1052  NA 138  K 4.4  CL 99  CO2 21*  GLUCOSE 150*  BUN 14  CREATININE 0.54  CALCIUM 9.0   ------------------------------------------------------------------------------------------------------------------  Cardiac Enzymes Recent Labs  Lab 04/22/18 1052  TROPONINI <0.03   ------------------------------------------------------------------------------------------------------------------  RADIOLOGY:  Ct Angio Chest Pe W And/or Wo Contrast  Result Date: 04/22/2018 CLINICAL DATA:  Shortness of breath for several hours EXAM: CT ANGIOGRAPHY CHEST WITH CONTRAST TECHNIQUE: Multidetector CT imaging of the chest was performed using the standard protocol during bolus administration of intravenous contrast. Multiplanar CT image reconstructions and MIPs were obtained to evaluate the vascular anatomy. CONTRAST:  80mL OMNIPAQUE IOHEXOL 350 MG/ML SOLN COMPARISON:  Plain film from earlier in the same day. FINDINGS: Cardiovascular: Thoracic aorta and its branches demonstrate minimal  calcifications. No dissection or  aneurysmal dilatation is noted. No coronary calcifications are noted. The pulmonary artery shows a normal branching pattern. No filling defects to suggest pulmonary emboli are noted. Mediastinum/Nodes: Thoracic inlet is within normal limits. No hilar or mediastinal adenopathy is noted. The esophagus is within normal limits. Lungs/Pleura: Lungs are well aerated bilaterally without focal confluent infiltrate or sizable effusion. No parenchymal nodules are seen. Some minimal scarring is noted in the apices bilaterally. Upper Abdomen: Visualized upper abdomen shows changes of gastric and gallbladder surgery. No acute abnormality noted. Musculoskeletal: Mild scoliosis concave to the right is noted. No acute bony abnormality is seen. Review of the MIP images confirms the above findings. IMPRESSION: No evidence of pulmonary emboli. No acute abnormality seen. Aortic Atherosclerosis (ICD10-I70.0). Electronically Signed   By: Alcide CleverMark  Lukens M.D.   On: 04/22/2018 14:13   Dg Chest Portable 1 View  Result Date: 04/22/2018 CLINICAL DATA:  Shortness of breath. EXAM: PORTABLE CHEST 1 VIEW COMPARISON:  Chest x-ray dated Jul 29, 2017. FINDINGS: The heart size and mediastinal contours are within normal limits. Normal pulmonary vascularity. No focal consolidation, pleural effusion, or pneumothorax. No acute osseous abnormality. IMPRESSION: No active disease. Electronically Signed   By: Obie DredgeWilliam T Derry M.D.   On: 04/22/2018 11:29     ASSESSMENT AND PLAN:   53 year old female patient with history of alcohol abuse, cirrhosis of liver currently under hospitalist service  -Acute alcohol withdrawal Continue CIWA protocol Thiamine folic acid supplementation IV fluids  -Lactic acidosis has resolved Probably from dehydration  -Tachycardia From alcohol withdrawal Continue metoprolol  -Chronic neuropathy Continue Neurontin  -Cirrhosis of liver Continue Xifaxan Supportive care  All the records are reviewed and case  discussed with Care Management/Social Worker. Management plans discussed with the patient, family and they are in agreement.  CODE STATUS: Full code  DVT Prophylaxis: SCDs  TOTAL TIME TAKING CARE OF THIS PATIENT: 36 minutes.   POSSIBLE D/C IN 1 to 2 DAYS, DEPENDING ON CLINICAL CONDITION.  Ihor AustinPavan Quinta Eimer M.D on 04/23/2018 at 1:42 PM  Between 7am to 6pm - Pager - [phone]  After 6pm go to [email or web address] - password [email or web address]  [facility]  Office  [phone]  CC: Primary care physician; Care, [facility]  Note: This dictation was prepared with Dragon dictation along with smaller phrase technology. Any transcriptional errors that result from this process are unintentional.

## 2018-04-23 NOTE — Progress Notes (Addendum)
Initial Nutrition Assessment  DOCUMENTATION CODES:   Non-severe (moderate) malnutrition in context of social or environmental circumstances  INTERVENTION:   - Ensure Max po BID, each supplement provides 150 kcal and 30 grams of protein  - Liberalize diet to Regular  - Recommend 2 chewable MVI with iron daily to be administered 2 hours prior to calcium citrate  - Recommend calcium citrate 500 mg TID to be administered at least 2 hours apart  - Recommend obtaining vitamin B-12 lab; if pt is deficient, recommend sublingual vitamin B-12 500 mcg daily  NUTRITION DIAGNOSIS:   Moderate Malnutrition related to social / environmental circumstances (EtOH abuse) as evidenced by mild fat depletion, moderate fat depletion, mild muscle depletion, moderate muscle depletion.  GOAL:   Patient will meet greater than or equal to 90% of their needs  MONITOR:   PO intake, Supplement acceptance, Weight trends, Labs  REASON FOR ASSESSMENT:   Malnutrition Screening Tool    ASSESSMENT:   53 year old female who presented to the ED on 1/28 with SOB. PMH significant for EtOH abuse (3-4 bottles of wine daily), CHF, alcoholic liver cirrhosis. Pt admitted for alcohol withdrawal.  Spoke with pt at bedside. Pt with half-eaten [employer] sandwich. Pt reports that she ate half for dinner last night and is eating the other half for "breakfast" today as she does not like breakfast foods.  Pt reports typically eating 2 meals daily, one mid-morning and supper. A meal may include half of a small filet of steak or a small bowl of pasta or grilled chicken. Pt states that if she does snack, she will have a fruit cup.  Pt shares that she had gastric bypass surgery in 2012. At that time, pt reports that she weighed 224 lbs. Pt states that she lost weight down to 99 lbs (3 years ago) and had to have both an NGT and a G-tube placed for nutrition because she wouldn't eat. Pt states that she does not remember this time  well. Pt shares that she had the G-tube for 6-8 weeks. Pt shares that recently she has been maintaining her weight at 117 lbs. Weight measured this morning was 118.4 lbs. Weight history in chart indicates weight gain of 12 kg over the last year.  Pt endorses feeling full after just a few bites of food which she attributes to gastric bypass restrictions. Pt shares that when she eats salad, she immediately has to go to the bathroom. Pt also reports that she likes Coke but does not drink it often as the carbonation makes her stomach upset.  Pt reports that her appetite decreased more in November 2019 when she broke her left hip. Pt states that she was in the hospital for 2 weeks and in rehab for 2 weeks and that she shortly thereafter fell and broke her right hip. Pt was using a wheelchair up until 2 weeks PTA and has since been walking.  Pt reports that she takes women's Centrum Silver MVI daily, folic acid, magnesium Lasix, potassium, and calcium. Pt is not sure whether she takes vitamin B-12.  RD provided education regarding the importance of adequate protein intake and adequate fluid intake. Encouraged pt to consume protein-rich foods first at meals. Discouraged intake of carbonated beverages and caffeine.  Medications reviewed and include: folic acid, magnesium oxide, MVI with minerals daily, Protonix, thiamine  Labs reviewed.  NUTRITION - FOCUSED PHYSICAL EXAM:    Most Recent Value  Orbital Region  Mild depletion  Upper Arm Region  Mild depletion  Thoracic and Lumbar Region  Moderate depletion  Buccal Region  Mild depletion  Temple Region  Mild depletion  Clavicle Bone Region  Moderate depletion  Clavicle and Acromion Bone Region  Moderate depletion  Scapular Bone Region  Moderate depletion  Dorsal Hand  Mild depletion  Patellar Region  Moderate depletion  Anterior Thigh Region  Moderate depletion  Posterior Calf Region  Moderate depletion  Edema (RD Assessment)  None  Hair  Reviewed   Eyes  Reviewed  Mouth  Reviewed  Skin  Reviewed  Nails  Reviewed       Diet Order:   Diet Order            Diet regular Room service appropriate? Yes; Fluid consistency: Thin  Diet effective now              EDUCATION NEEDS:   Education needs have been addressed  Skin:  Skin Assessment: Reviewed RN Assessment  Last BM:  1/28 (small type 7)  Height:   Ht Readings from Last 1 Encounters:  04/22/18 5\' 4"  (1.626 m)    Weight:   Wt Readings from Last 1 Encounters:  04/23/18 53.7 kg    Ideal Body Weight:  54.5 kg  BMI:  Body mass index is 20.32 kg/m.  Estimated Nutritional Needs:   Kcal:  1600-1800  Protein:  70-80 grams  Fluid:  1.6-1.8 L    Earma Reading, MS, RD, LDN Inpatient Clinical Dietitian Pager: [phone] Weekend/After Hours: [phone]

## 2018-04-23 NOTE — Progress Notes (Signed)
Advanced care plan.  Purpose of the Encounter: CODE STATUS  Parties in Attendance: Patient  Patient's Decision Capacity: Good  Subjective/Patient's story: Presented to emergency room with increased anxiety   Objective/Medical story Drinks alcohol regularly Has alcohol withdrawal and dehydration and lactic acidosis   Goals of care determination:  Needs IV fluids and CIWA protocol for alcohol withdrawal Advance care directives goals of care and treatment plan discussed Patient wants everything done which includes CPR, intubation ventilator the need arises   CODE STATUS: Full code   Time spent discussing advanced care planning: 16 minutes

## 2018-04-24 LAB — BASIC METABOLIC PANEL
Anion gap: 7 (ref 5–15)
BUN: 8 mg/dL (ref 6–20)
CHLORIDE: 110 mmol/L (ref 98–111)
CO2: 23 mmol/L (ref 22–32)
Calcium: 8.2 mg/dL — ABNORMAL LOW (ref 8.9–10.3)
Creatinine, Ser: 0.51 mg/dL (ref 0.44–1.00)
GFR calc Af Amer: >60 mL/min (ref >60)
GFR calc non Af Amer: >60 mL/min (ref >60)
Glucose, Bld: 87 mg/dL (ref 70–99)
Potassium: 2.6 mmol/L — CL (ref 3.5–5.1)
Sodium: 140 mmol/L (ref 135–145)

## 2018-04-24 LAB — POTASSIUM: Potassium: 2.9 mmol/L — ABNORMAL LOW (ref 3.5–5.1)

## 2018-04-24 LAB — PHOSPHORUS: Phosphorus: 2.8 mg/dL (ref 2.5–4.6)

## 2018-04-24 LAB — MAGNESIUM: Magnesium: 1.8 mg/dL (ref 1.7–2.4)

## 2018-04-24 MED ORDER — POTASSIUM CHLORIDE 20 MEQ PO PACK
60.0000 meq | PACK | Freq: Once | ORAL | Status: AC
  Administered 2018-04-24: 60 meq via ORAL
  Filled 2018-04-24: qty 3

## 2018-04-24 MED ORDER — POTASSIUM CHLORIDE CRYS ER 20 MEQ PO TBCR: 60.0000 meq | EXTENDED_RELEASE_TABLET | Freq: Once | ORAL | Status: DC

## 2018-04-24 MED ORDER — LORAZEPAM 2 MG/ML IJ SOLN
2.0000 mg | INTRAMUSCULAR | Status: DC | PRN
  Administered 2018-04-24 – 2018-04-25 (×2): 2 mg via INTRAVENOUS
  Filled 2018-04-24 (×2): qty 1

## 2018-04-24 MED ORDER — ONDANSETRON HCL 4 MG PO TABS
4.0000 mg | ORAL_TABLET | Freq: Once | ORAL | Status: AC
  Administered 2018-04-24: 4 mg via ORAL
  Filled 2018-04-24: qty 1

## 2018-04-24 MED ORDER — POTASSIUM CHLORIDE CRYS ER 20 MEQ PO TBCR
60.0000 meq | EXTENDED_RELEASE_TABLET | Freq: Once | ORAL | Status: AC
  Administered 2018-04-24: 60 meq via ORAL
  Filled 2018-04-24: qty 3

## 2018-04-24 MED ORDER — ONDANSETRON HCL 4 MG/2ML IJ SOLN
4.0000 mg | Freq: Four times a day (QID) | INTRAMUSCULAR | Status: DC | PRN
  Administered 2018-04-24: 4 mg via INTRAVENOUS
  Filled 2018-04-24 (×2): qty 2

## 2018-04-24 MED ORDER — OXYCODONE HCL 5 MG PO TABS
5.0000 mg | ORAL_TABLET | Freq: Every day | ORAL | Status: DC
  Administered 2018-04-24 – 2018-04-25 (×2): 5 mg via ORAL
  Filled 2018-04-24 (×2): qty 1

## 2018-04-24 MED ORDER — POTASSIUM CHLORIDE 10 MEQ/100ML IV SOLN
10.0000 meq | INTRAVENOUS | Status: AC
  Administered 2018-04-24 (×4): 10 meq via INTRAVENOUS
  Filled 2018-04-24 (×4): qty 100

## 2018-04-24 MED ORDER — POTASSIUM CHLORIDE CRYS ER 20 MEQ PO TBCR: 40.0000 meq | EXTENDED_RELEASE_TABLET | Freq: Once | ORAL | Status: DC

## 2018-04-24 NOTE — Progress Notes (Signed)
[facility] - [place] [facility] [facility]   PATIENT NAME: Joanna Sanchez    MR#:  [id number]  DATE OF BIRTH:  1966-01-12  SUBJECTIVE:  CHIEF COMPLAINT:   Chief Complaint  Patient presents with  . Shortness of Breath  Patient seen today No complaints of chest pain No nausea Has generalized weakness Tremors in the upper extremities  REVIEW OF SYSTEMS:    ROS  CONSTITUTIONAL: No documented fever. Has fatigue, weakness. No weight gain, no weight loss.  EYES: No blurry or double vision.  ENT: No tinnitus. No postnasal drip. No redness of the oropharynx.  RESPIRATORY: No cough, no wheeze, no hemoptysis. No dyspnea.  CARDIOVASCULAR: No chest pain. No orthopnea. No palpitations. No syncope.  GASTROINTESTINAL: No nausea, no vomiting or diarrhea. No abdominal pain. No melena or hematochezia.  GENITOURINARY: No dysuria or hematuria.  ENDOCRINE: No polyuria or nocturia. No heat or cold intolerance.  HEMATOLOGY: No anemia. No bruising. No bleeding.  INTEGUMENTARY: No rashes. No lesions.  MUSCULOSKELETAL: No arthritis. No swelling. No gout.  NEUROLOGIC: No numbness, tingling, or ataxia. No seizure-type activity.  PSYCHIATRIC: No anxiety. No insomnia. No ADD.   DRUG ALLERGIES:   Allergies  Allergen Reactions  . Eliquis [Apixaban] Anaphylaxis  . Niacin Anaphylaxis, Dermatitis and Shortness Of Breath  . Niacin And Related   . Other     Patient states that she had an anaphylactic reaction to a blood thinner but does not remember the name of it     VITALS:  Blood pressure (!) 161/91, pulse (!) 105, temperature 98 F (36.7 C), resp. rate 20, height 5\' 4"  (1.626 m), weight 52.8 kg, SpO2 100 %.  PHYSICAL EXAMINATION:   Physical Exam  GENERAL:  53 y.o.-year-old patient lying in the bed with no acute distress.  EYES: Pupils equal, round, reactive to light and accommodation. No scleral icterus. Extraocular muscles intact.  HEENT: Head atraumatic, normocephalic. Oropharynx and  nasopharynx clear.  NECK:  Supple, no jugular venous distention. No thyroid enlargement, no tenderness.  LUNGS: Normal breath sounds bilaterally, no wheezing, rales, rhonchi. No use of accessory muscles of respiration.  CARDIOVASCULAR: S1, S2 tachycardia noted. No murmurs, rubs, or gallops.  ABDOMEN: Soft, nontender, nondistended. Bowel sounds present. No organomegaly or mass.  EXTREMITIES: No cyanosis, clubbing or edema b/l.    NEUROLOGIC: Cranial nerves II through XII are intact. No focal Motor or sensory deficits b/l.   PSYCHIATRIC: The patient is alert and oriented x 3.  SKIN: No obvious rash, lesion, or ulcer.   LABORATORY PANEL:   CBC Recent Labs  Lab 04/22/18 1052  WBC 6.4  HGB 13.5  HCT 42.3  PLT 208   ------------------------------------------------------------------------------------------------------------------ Chemistries  Recent Labs  Lab 04/24/18 0426  NA 140  K 2.6*  CL 110  CO2 23  GLUCOSE 87  BUN 8  CREATININE 0.51  CALCIUM 8.2*  MG 1.8   ------------------------------------------------------------------------------------------------------------------  Cardiac Enzymes Recent Labs  Lab 04/22/18 1052  TROPONINI <0.03   ------------------------------------------------------------------------------------------------------------------  RADIOLOGY:  Ct Angio Chest Pe W And/or Wo Contrast  Result Date: 04/22/2018 CLINICAL DATA:  Shortness of breath for several hours EXAM: CT ANGIOGRAPHY CHEST WITH CONTRAST TECHNIQUE: Multidetector CT imaging of the chest was performed using the standard protocol during bolus administration of intravenous contrast. Multiplanar CT image reconstructions and MIPs were obtained to evaluate the vascular anatomy. CONTRAST:  60mL OMNIPAQUE IOHEXOL 350 MG/ML SOLN COMPARISON:  Plain film from earlier in the same day. FINDINGS: Cardiovascular: Thoracic aorta and its branches demonstrate  minimal calcifications. No dissection or  aneurysmal dilatation is noted. No coronary calcifications are noted. The pulmonary artery shows a normal branching pattern. No filling defects to suggest pulmonary emboli are noted. Mediastinum/Nodes: Thoracic inlet is within normal limits. No hilar or mediastinal adenopathy is noted. The esophagus is within normal limits. Lungs/Pleura: Lungs are well aerated bilaterally without focal confluent infiltrate or sizable effusion. No parenchymal nodules are seen. Some minimal scarring is noted in the apices bilaterally. Upper Abdomen: Visualized upper abdomen shows changes of gastric and gallbladder surgery. No acute abnormality noted. Musculoskeletal: Mild scoliosis concave to the right is noted. No acute bony abnormality is seen. Review of the MIP images confirms the above findings. IMPRESSION: No evidence of pulmonary emboli. No acute abnormality seen. Aortic Atherosclerosis (ICD10-I70.0). Electronically Signed   By: Alcide Clever M.D.   On: 04/22/2018 14:13     ASSESSMENT AND PLAN:   53 year old female patient with history of alcohol abuse, cirrhosis of liver currently under hospitalist service  -Acute hypokalemia Replace potassium intravenously and orally aggressively Check magnesium level  -Acute alcohol withdrawal Continue CIWA protocol Thiamine folic acid supplementation  -Lactic acidosis has resolved Probably from dehydration  -Tachycardia From alcohol withdrawal Continue metoprolol for rate control  -Chronic neuropathy Continue Neurontin  -Cirrhosis of liver Continue Xifaxan Supportive care  All the records are reviewed and case discussed with Care Management/Social Worker. Management plans discussed with the patient, family and they are in agreement.  CODE STATUS: Full code  DVT Prophylaxis: SCDs  TOTAL TIME TAKING CARE OF THIS PATIENT: 34 minutes.   POSSIBLE D/C IN 1 to 2 DAYS, DEPENDING ON CLINICAL CONDITION.  Ihor Austin M.D on 04/24/2018 at 1:30 PM  Between 7am  to 6pm - Pager - [phone]  After 6pm go to [email or web address] - password [email or web address]  [facility] [place] [facility]  Office  [phone]  CC: Primary care physician; [facility], [place] [facility]  Note: This dictation was prepared with Dragon dictation along with smaller phrase technology. Any transcriptional errors that result from this process are unintentional.

## 2018-04-24 NOTE — Plan of Care (Signed)
Patient c/o anxiety, headache, nausea, and pain throughout shift. Unsteady on feet. PRN meds given, see MAR.   Problem: Activity: Goal: Risk for activity intolerance will decrease Outcome: Not Progressing   Problem: Coping: Goal: Level of anxiety will decrease Outcome: Not Progressing

## 2018-04-24 NOTE — Progress Notes (Addendum)
CRITICAL VALUE STICKER  CRITICAL VALUE: potassium of 2.6  RECEIVER (on-site recipient of call): B. Krystian Younglove, RN  DATE & TIME NOTIFIED: 04/24/2018 0500  MESSENGER (representative from lab):  MD NOTIFIED: Salary  TIME OF NOTIFICATION:0503  RESPONSE: 60 mEq PO Potassium STAT

## 2018-04-24 NOTE — Progress Notes (Signed)
Patient did not receive KCl 60 mEq ordered at 1/30 0515. New KCl orders inputted after discussing with Dr. Tobi Bastos.

## 2018-04-25 LAB — CBC
HEMATOCRIT: 32.1 % — AB (ref 36.0–46.0)
Hemoglobin: 9.8 g/dL — ABNORMAL LOW (ref 12.0–15.0)
MCH: 27.8 pg (ref 26.0–34.0)
MCHC: 30.5 g/dL (ref 30.0–36.0)
MCV: 91.2 fL (ref 80.0–100.0)
Platelets: 109 10*3/uL — ABNORMAL LOW (ref 150–400)
RBC: 3.52 MIL/uL — ABNORMAL LOW (ref 3.87–5.11)
RDW: 17 % — ABNORMAL HIGH (ref 11.5–15.5)
WBC: 4.4 10*3/uL (ref 4.0–10.5)
nRBC: 0 % (ref 0.0–0.2)

## 2018-04-25 LAB — FERRITIN: Ferritin: 28 ng/mL (ref 11–307)

## 2018-04-25 LAB — IRON AND TIBC
Iron: 28 ug/dL (ref 28–170)
Saturation Ratios: 8 % — ABNORMAL LOW (ref 10.4–31.8)
TIBC: 357 ug/dL (ref 250–450)
UIBC: 329 ug/dL

## 2018-04-25 LAB — BASIC METABOLIC PANEL
Anion gap: 4 — ABNORMAL LOW (ref 5–15)
BUN: <5 mg/dL — ABNORMAL LOW (ref 6–20)
CO2: 24 mmol/L (ref 22–32)
Calcium: 8.4 mg/dL — ABNORMAL LOW (ref 8.9–10.3)
Chloride: 112 mmol/L — ABNORMAL HIGH (ref 98–111)
Creatinine, Ser: 0.51 mg/dL (ref 0.44–1.00)
GFR calc Af Amer: >60 mL/min (ref >60)
GFR calc non Af Amer: >60 mL/min (ref >60)
Glucose, Bld: 83 mg/dL (ref 70–99)
Potassium: 3.5 mmol/L (ref 3.5–5.1)
Sodium: 140 mmol/L (ref 135–145)

## 2018-04-25 LAB — FOLATE: Folate: 36 ng/mL (ref >5.9)

## 2018-04-25 LAB — VITAMIN B12: VITAMIN B 12: 761 pg/mL (ref 180–914)

## 2018-04-25 MED ORDER — POTASSIUM CHLORIDE CRYS ER 20 MEQ PO TBCR
40.0000 meq | EXTENDED_RELEASE_TABLET | Freq: Once | ORAL | Status: AC
  Administered 2018-04-25: 40 meq via ORAL
  Filled 2018-04-25: qty 2

## 2018-04-25 MED ORDER — FERROUS SULFATE 325 (65 FE) MG PO TBEC: 325.0000 mg | DELAYED_RELEASE_TABLET | Freq: Two times a day (BID) | ORAL | 3 refills | Status: AC

## 2018-04-25 NOTE — Progress Notes (Signed)
Pt to be discharged this evening. Iv and tele removed. disch instructions given to her. Her own meds returned to her from pharmacy. Awaiting husband for transport.

## 2018-04-25 NOTE — Discharge Summary (Signed)
[facility] -  at [facility]   PATIENT NAME: Joanna Sanchez    MR#:  [id number]  DATE OF BIRTH:  [birth year]  DATE OF ADMISSION:  04/22/2018 ADMITTING PHYSICIAN: Ramonita Lab, MD  DATE OF DISCHARGE: 04/25/2018  PRIMARY CARE PHYSICIAN: Care, [place] [facility]   ADMISSION DIAGNOSIS:  Alcohol withdrawal syndrome without complication (HCC) [F10.230] Dyspnea, unspecified type [R06.00]  DISCHARGE DIAGNOSIS:  Alcohol withdrawal Moderate malnutrition Iron deficiency anemia Metabolic acidosis Lactic acidosis Dehydration Acute hypokalemia  SECONDARY DIAGNOSIS:   Past Medical History:  Diagnosis Date  . Alcohol abuse   . CHF (congestive heart failure) (HCC)   . Liver disease   . Neuropathy      ADMITTING HISTORY Joanna Sanchez  is a 53 y.o. female with a known history of alcohol abuse, alcoholic liver cirrhosis, neuropathy, chronic congestive heart failure is presenting to the ED with a chief complaint of anxiety associated with the shakes of her extremities.  Patient admits to drinking alcohol heavily and she takes wine 3-4 bottles every day and last intake was yesterday night.  Initial CIWA score was 23 and eventually after hydrating with IV fluid boluses it went down to 13.  But patient was still shaky and unsteady.  CT angiogram of the chest is negative for pulmonary embolism and hospitalist team is called admit the patient.  Patient was tachycardic still  HOSPITAL COURSE:  Patient admitted to medical floor.  She was worked up with a CTA chest which showed no pulmonary.  Was put on CIWA protocol.  Received thiamine and folic acid supplements.  Work-up revealed iron deficiency.  Alcohol cessation was counseled to the patient.  We will continue Coreg, diuresis with Lasix at home.  She has history of cirrhosis of liver and will continue Xifaxan.  Iron supplements have been ordered.  Hypokalemia has resolved.  Potassium was given IV and orally aggressively.  Tachycardia  improved.  Lactic acidosis resolved.  Dehydration resolved.  CONSULTS OBTAINED:    DRUG ALLERGIES:   Allergies  Allergen Reactions  . Eliquis [Apixaban] Anaphylaxis  . Niacin Anaphylaxis, Dermatitis and Shortness Of Breath  . Niacin And Related   . Other     Patient states that she had an anaphylactic reaction to a blood thinner but does not remember the name of it     DISCHARGE MEDICATIONS:   Allergies as of 04/25/2018      Reactions   Eliquis [apixaban] Anaphylaxis   Niacin Anaphylaxis, Dermatitis, Shortness Of Breath   Niacin And Related    Other    Patient states that she had an anaphylactic reaction to a blood thinner but does not remember the name of it       Medication List    TAKE these medications   azelastine 0.05 % ophthalmic solution Commonly known as:  OPTIVAR Place 1 drop into both eyes daily.   butalbital-acetaminophen-caffeine 50-325-40 MG tablet Commonly known as:  FIORICET, ESGIC Take 1 tablet by mouth 2 (two) times daily as needed for headache.   carvedilol 12.5 MG tablet Commonly known as:  COREG Take 12.5 mg by mouth 2 (two) times daily with a meal.   CENTRUM ULTRA WOMENS Tabs Take 1 tablet by mouth daily.   cetirizine 10 MG tablet Commonly known as:  ZYRTEC Take 10 mg by mouth daily.   cyclobenzaprine 5 MG tablet Commonly known as:  FLEXERIL Take 5 mg by mouth 3 (three) times daily as needed for muscle spasms.   DULoxetine 60 MG capsule Commonly  known as:  CYMBALTA Take 120 mg by mouth daily.   ferrous sulfate 325 (65 FE) MG EC tablet Take 1 tablet (325 mg total) by mouth 2 (two) times daily.   furosemide 40 MG tablet Commonly known as:  LASIX Take 40 mg by mouth daily.   gabapentin 300 MG capsule Commonly known as:  NEURONTIN Take 600 mg by mouth 3 (three) times daily.   MAGOX 400 400 (241.3 Mg) MG tablet Generic drug:  magnesium oxide Take 400 mg by mouth daily.   pantoprazole 40 MG tablet Commonly known as:   PROTONIX Take 1 tablet by mouth 2 (two) times daily.   potassium chloride SA 20 MEQ tablet Commonly known as:  K-DUR,KLOR-CON Take 1 tablet by mouth daily.   QUEtiapine 100 MG tablet Commonly known as:  SEROQUEL Take 100 mg by mouth at bedtime.   rifaximin 550 MG Tabs tablet Commonly known as:  XIFAXAN Take 550 mg by mouth 2 (two) times daily.   sucralfate 1 g tablet Commonly known as:  CARAFATE Take 1 tablet by mouth 4 (four) times daily.   thiamine 100 MG tablet Take 100 mg by mouth daily.       Today  Patient seen and evaluated on the day of discharge Tolerating diet well No chest pain No shortness of breath Hemodynamically stable and will be discharged home  VITAL SIGNS:  Blood pressure 135/79, pulse (!) 104, temperature 98.6 F (37 C), resp. rate 18, height 5\' 4"  (1.626 m), weight 52.9 kg, SpO2 98 %.  I/O:    Intake/Output Summary (Last 24 hours) at 04/25/2018 1253 Last data filed at 04/25/2018 0944 Gross per 24 hour  Intake 0 ml  Output 150 ml  Net -150 ml    PHYSICAL EXAMINATION:  Physical Exam  GENERAL:  53 y.o.-year-old patient lying in the bed with no acute distress.  LUNGS: Normal breath sounds bilaterally, no wheezing, rales,rhonchi or crepitation. No use of accessory muscles of respiration.  CARDIOVASCULAR: S1, S2 normal. No murmurs, rubs, or gallops.  ABDOMEN: Soft, non-tender, non-distended. Bowel sounds present. No organomegaly or mass.  NEUROLOGIC: Moves all 4 extremities. PSYCHIATRIC: The patient is alert and oriented x 3.  SKIN: No obvious rash, lesion, or ulcer.   DATA REVIEW:   CBC Recent Labs  Lab 04/25/18 0638  WBC 4.4  HGB 9.8*  HCT 32.1*  PLT 109*    Chemistries  Recent Labs  Lab 04/24/18 0426  04/25/18 0638  NA 140  --  140  K 2.6*   < > 3.5  CL 110  --  112*  CO2 23  --  24  GLUCOSE 87  --  83  BUN 8  --  <5*  CREATININE 0.51  --  0.51  CALCIUM 8.2*  --  8.4*  MG 1.8  --   --    < > = values in this interval  not displayed.    Cardiac Enzymes Recent Labs  Lab 04/22/18 1052  TROPONINI <0.03    Microbiology Results  Results for orders placed or performed during the hospital encounter of 07/29/17  MRSA PCR Screening     Status: None   Collection Time: 07/29/17  9:52 PM  Result Value Ref Range Status   MRSA by PCR NEGATIVE NEGATIVE Final    Comment:        The GeneXpert MRSA Assay (FDA approved for NASAL specimens only), is one component of a comprehensive MRSA colonization surveillance program. It is not intended to  diagnose MRSA infection nor to guide or monitor treatment for MRSA infections. Performed at [facility], [address] [place], [place] [address]   Urine Culture     Status: Abnormal   Collection Time: 08/01/17  6:54 PM  Result Value Ref Range Status   Specimen Description   Final    URINE, RANDOM Performed at [facility], [address] [place], [place] [address]    Special Requests   Final    Normal Performed at [facility], [address] [place], [place] [address]    Culture >=100,000 COLONIES/mL ESCHERICHIA COLI (A)  Final   Report Status 08/04/2017 FINAL  Final   Organism ID, Bacteria ESCHERICHIA COLI (A)  Final      Susceptibility   Escherichia coli - MIC*    AMPICILLIN >=32 RESISTANT Resistant     CEFAZOLIN <=4 SENSITIVE Sensitive     CEFTRIAXONE <=1 SENSITIVE Sensitive     CIPROFLOXACIN <=0.25 SENSITIVE Sensitive     GENTAMICIN >=16 RESISTANT Resistant     IMIPENEM <=0.25 SENSITIVE Sensitive     NITROFURANTOIN <=16 SENSITIVE Sensitive     TRIMETH/SULFA >=320 RESISTANT Resistant     AMPICILLIN/SULBACTAM >=32 RESISTANT Resistant     PIP/TAZO <=4 SENSITIVE Sensitive     Extended ESBL NEGATIVE Sensitive     * >=100,000 COLONIES/mL ESCHERICHIA COLI    RADIOLOGY:  No results found.  Follow up with PCP in 1 week.  Management plans discussed with the patient, family and they are in agreement.  CODE STATUS: Full  code Code Status History    Date Active Date Inactive Code Status Order ID Comments User Context   11/26/2017 0601 11/26/2017 1705 Full Code [id number]  Darci CurrentBrown,  N, MD ED   07/29/2017 1857 08/03/2017 1614 Full Code [id number]  Milagros LollSudini, Srikar, MD ED      TOTAL TIME TAKING CARE OF THIS PATIENT ON DAY OF DISCHARGE: more than 35 minutes.   Ihor AustinPavan Pyreddy M.D on 04/25/2018 at 12:53 PM  Between 7am to 6pm - Pager - [phone]  After 6pm go to [email or web address] - password [email or web address]  [facility] [place] [facility]  Office  [phone]  CC: Primary care physician; [facility], [place] [facility]  Note: This dictation was prepared with Dragon dictation along with smaller phrase technology. Any transcriptional errors that result from this process are unintentional.

## 2018-04-26 LAB — VITAMIN D 25 HYDROXY (VIT D DEFICIENCY, FRACTURES): Vit D, 25-Hydroxy: 33.8 ng/mL (ref 30.0–100.0)

## 2018-04-28 LAB — VITAMIN E
Vitamin E (Alpha Tocopherol): 12.3 mg/L (ref 7.0–25.1)
Vitamin E(Gamma Tocopherol): 1 mg/L (ref 0.5–5.5)

## 2018-04-28 LAB — VITAMIN B1: Vitamin B1 (Thiamine): 189.9 nmol/L (ref 66.5–200.0)

## 2018-04-28 LAB — VITAMIN A: Vitamin A (Retinoic Acid): 63.3 ug/dL — ABNORMAL HIGH (ref 20.1–62.0)

## 2018-04-29 LAB — ZINC: ZINC: 53 ug/dL — AB (ref 56–134)

## 2018-04-29 LAB — COPPER, SERUM: Copper: 75 ug/dL (ref 72–166)

## 2018-05-05 LAB — VITAMIN K1, SERUM: VITAMIN K1: 0.13 ng/mL (ref 0.13–1.88)

## 2018-05-06 ENCOUNTER — Encounter: Payer: Self-pay | Admitting: Dietician

## 2018-05-06 ENCOUNTER — Telehealth: Payer: Self-pay | Admitting: Dietician

## 2018-05-06 NOTE — Progress Notes (Unsigned)
  Results for [name], Joanna Sanchez Joanna Sanchez (MRN [id number]) as of 05/06/2018 10:52  Ref. Range 04/22/2018 16:48 04/22/2018 19:40 04/24/2018 04:26 04/24/2018 14:01 04/25/2018 06:38  Iron Latest Ref Range: 28 - 170 ug/dL     28  UIBC Latest Units: ug/dL     [id number]  TIBC Latest Ref Range: 250 - 450 ug/dL     [id number]  Saturation Ratios Latest Ref Range: 10.4 - 31.8 %     8 (L)  Ferritin Latest Ref Range: 11 - 307 ng/mL     28  Folate Latest Ref Range: >5.9 ng/mL     36.0  Copper Latest Ref Range: 72 - 166 ug/dL     75  Lactic Acid, Venous Latest Ref Range: 0.5 - 1.9 mmol/L 1.1 0.8     Vitamin B1 (Thiamine) Latest Ref Range: 66.5 - 200.0 nmol/L     189.9  Vitamin D, 25-Hydroxy Latest Ref Range: 30.0 - 100.0 ng/mL     33.8  Vitamin A (Retinoic Acid) Latest Ref Range: 20.1 - 62.0 ug/dL     91.463.3 (H)  Vitamin N82B12 Latest Ref Range: 180 - 914 pg/mL     761  Vitamin E (Alpha Tocopherol) Latest Ref Range: 7.0 - 25.1 mg/L     12.3  Vitamin E(Gamma Tocopherol) Latest Ref Range: 0.5 - 5.5 mg/L     1.0  Zinc Latest Ref Range: 56 - 134 ug/dL     53 (L)   [name] MS, RD, LDN Pager #- [phone] Office#- [phone] After Hours Pager: [phone]

## 2018-12-20 IMAGING — DX DG HAND COMPLETE 3+V*L*
3 series · 3 of 3 positions shown · non-contrast
Comparison: None.

CLINICAL DATA: Pain and swelling following injury yesterday.

EXAM:
LEFT HAND - COMPLETE 3+ VIEW

[hand ap]
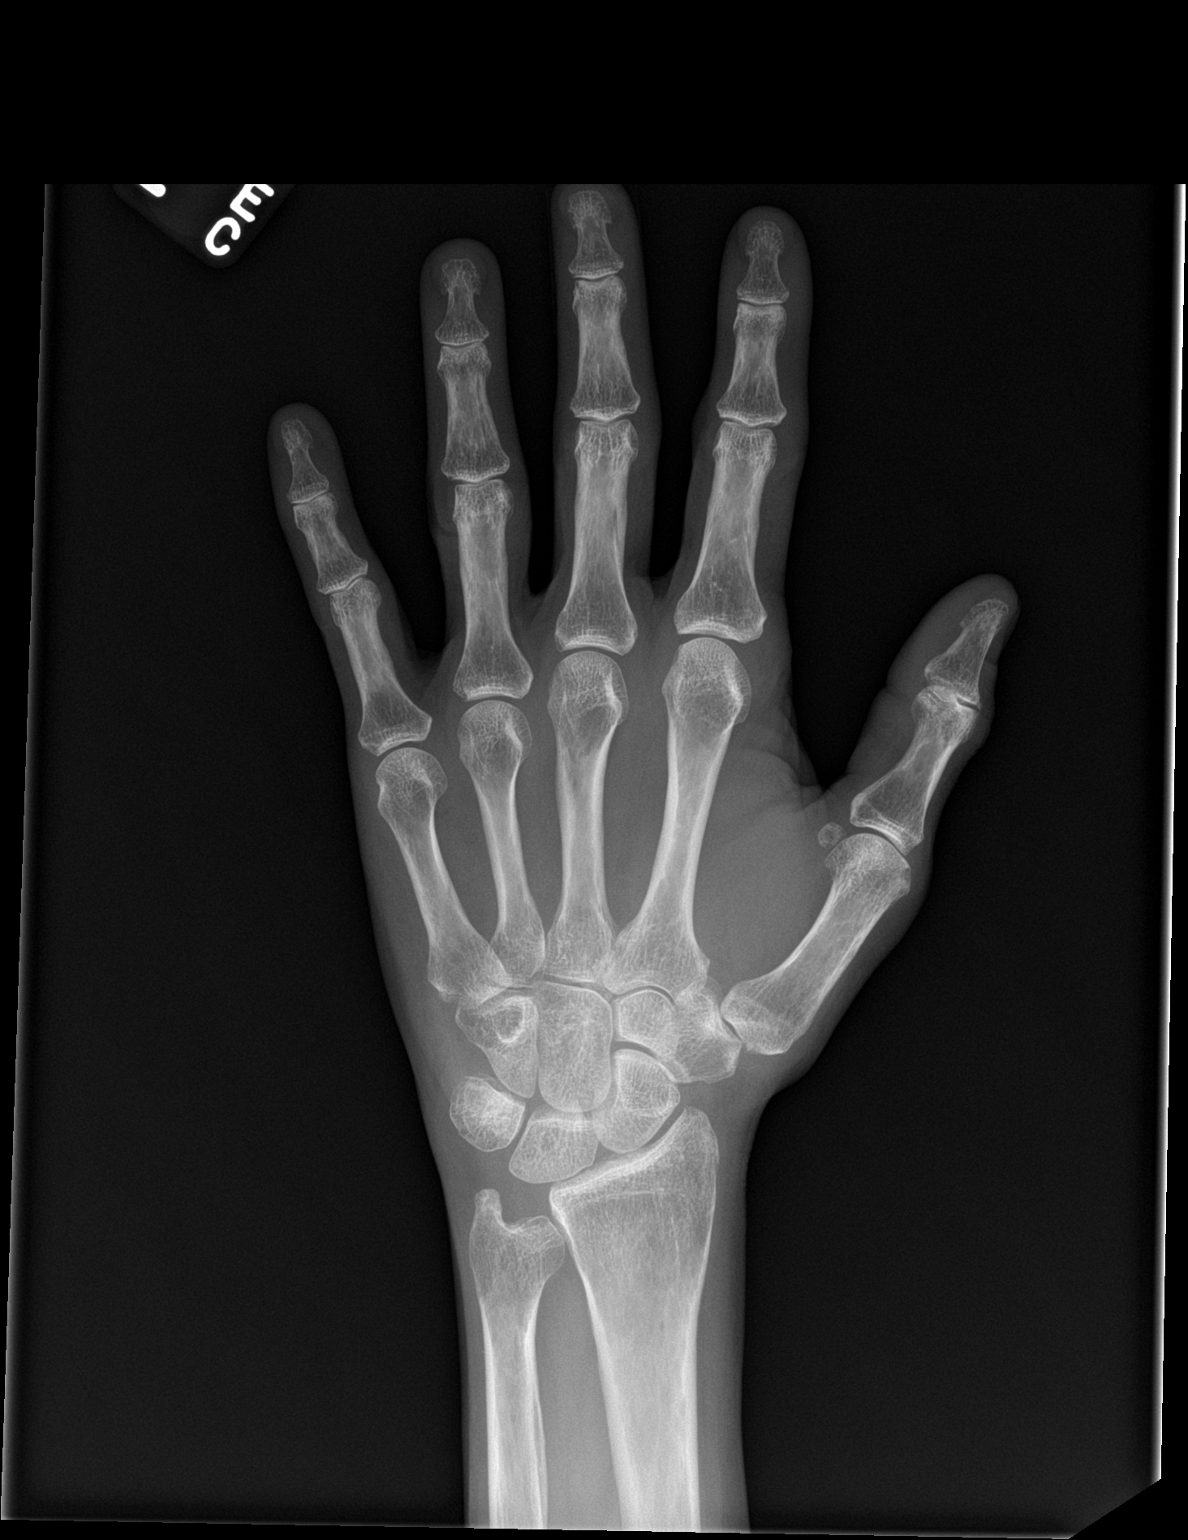

[hand obl]
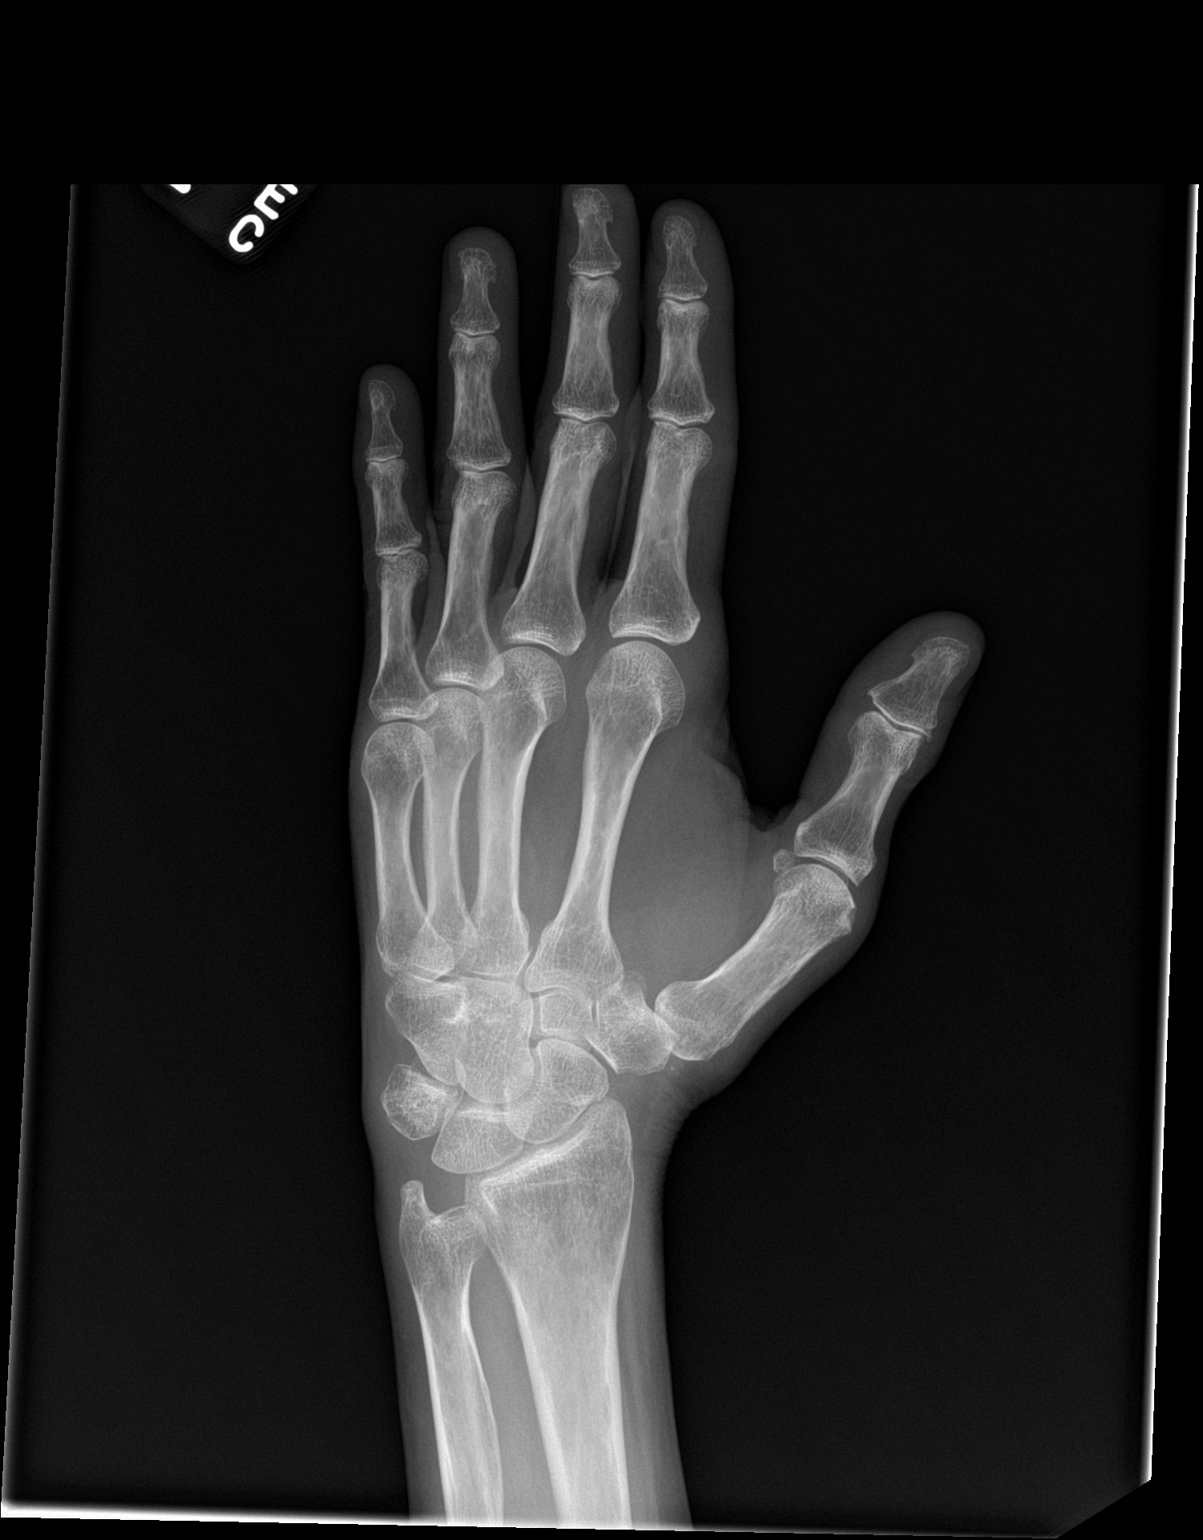

[hand lat]
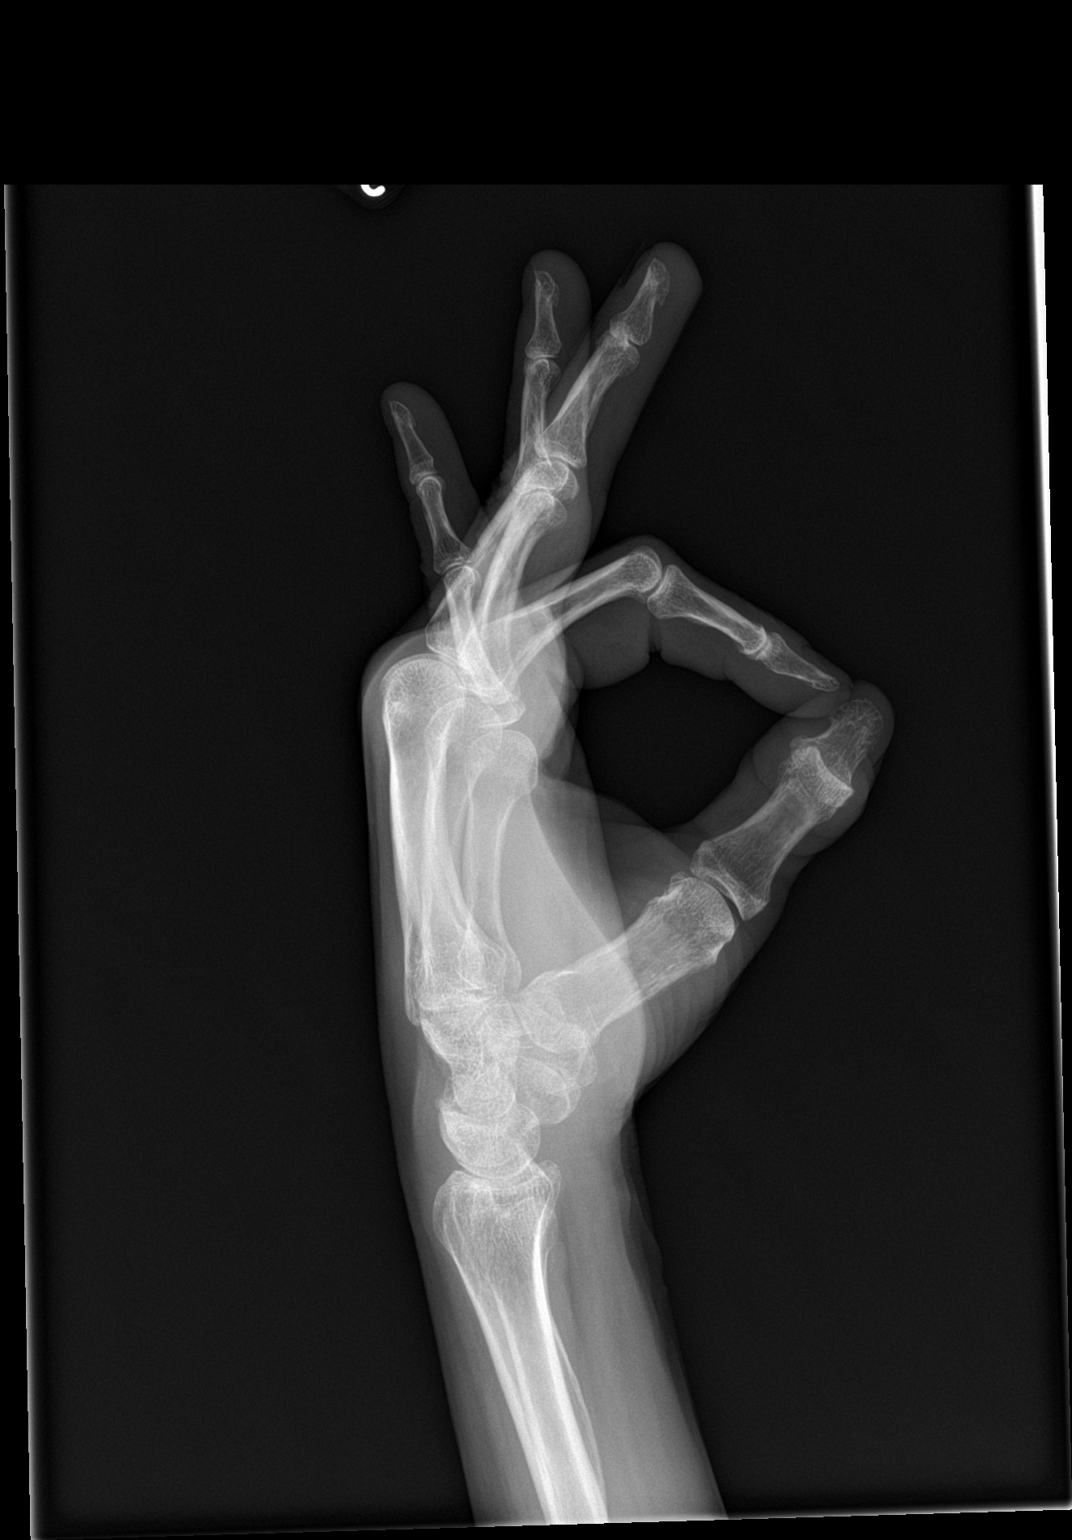

[3 of 3 positions shown; findings below may reference images not displayed]

FINDINGS: The joint spaces are maintained. No acute fracture is identified.
Moderate negative ulnar variance is noted.
IMPRESSION: No acute bony findings.

## 2019-04-13 IMAGING — CR DG CHEST 2V
1 series · 2 of 2 positions shown · non-contrast
Comparison: Prior chest x-ray 04/07/2013

CLINICAL DATA: 51-year-old female with shortness of breath and
history of congestive heart failure

EXAM:
CHEST - 2 VIEW

[Series 1: dg chest 2 view · 0.14mm/px · 2 of 2 slices shown]
[im 1/2]
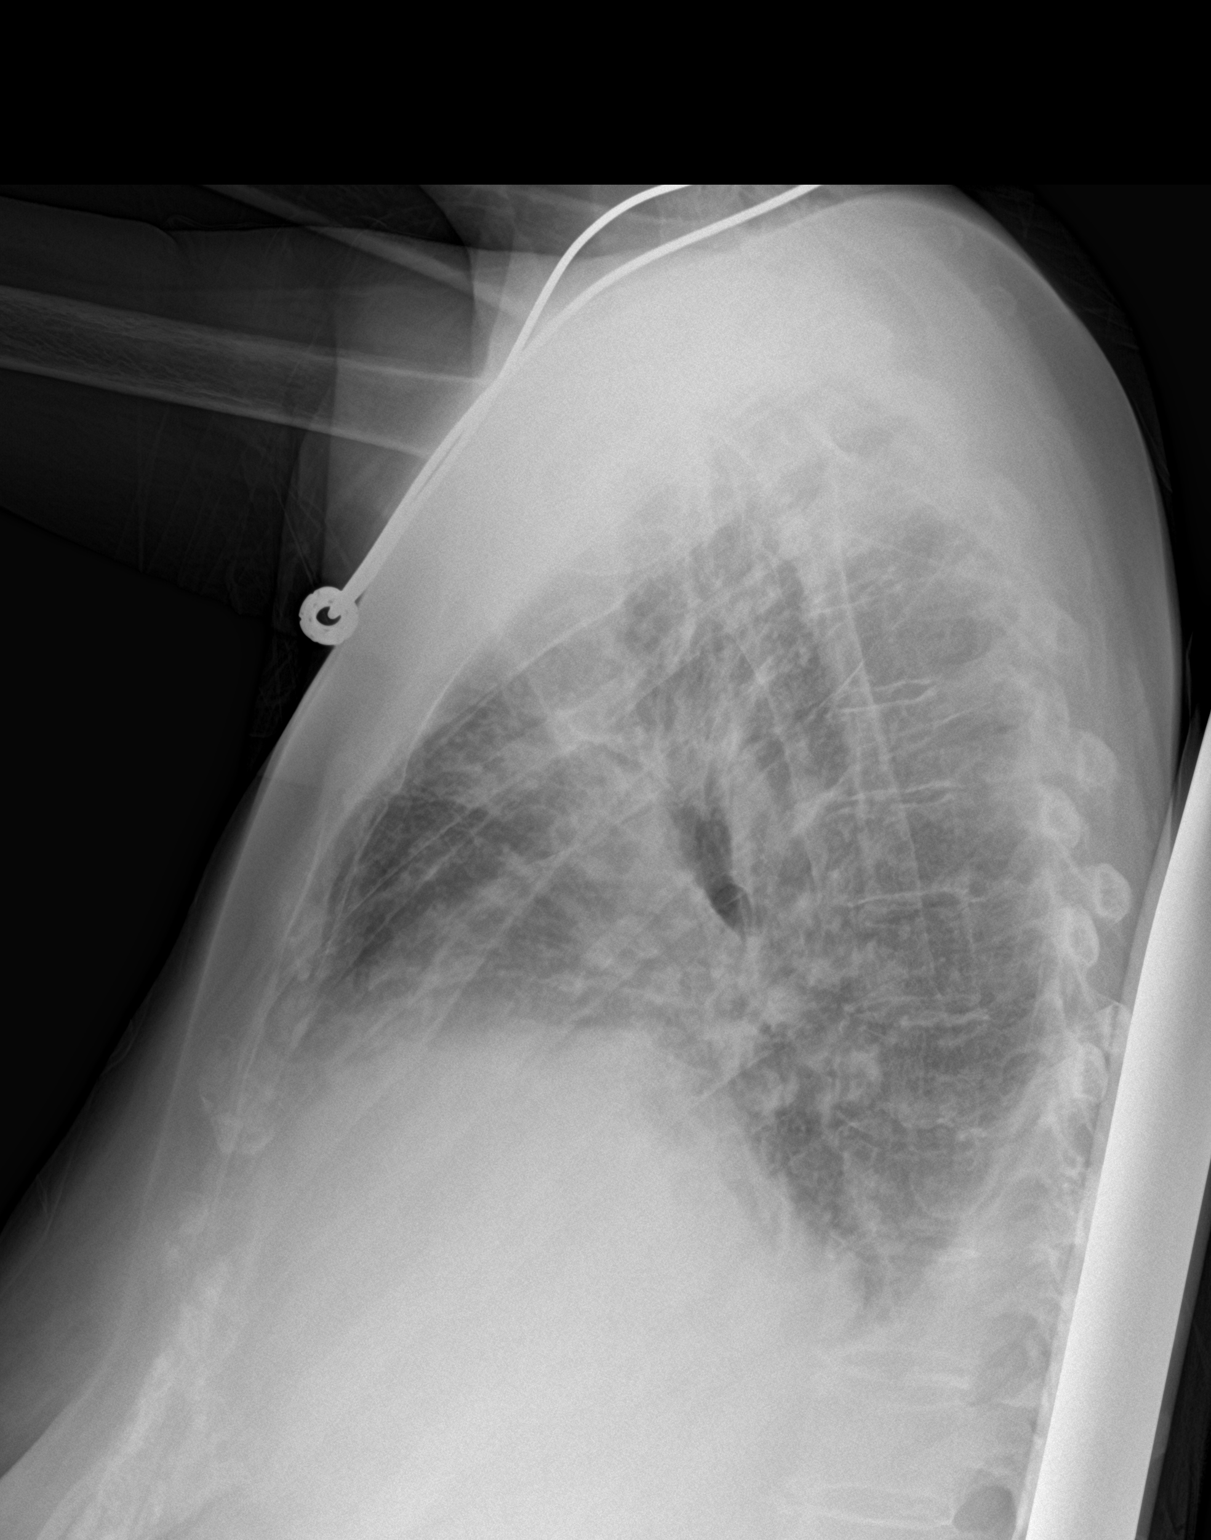
[im 2/2]
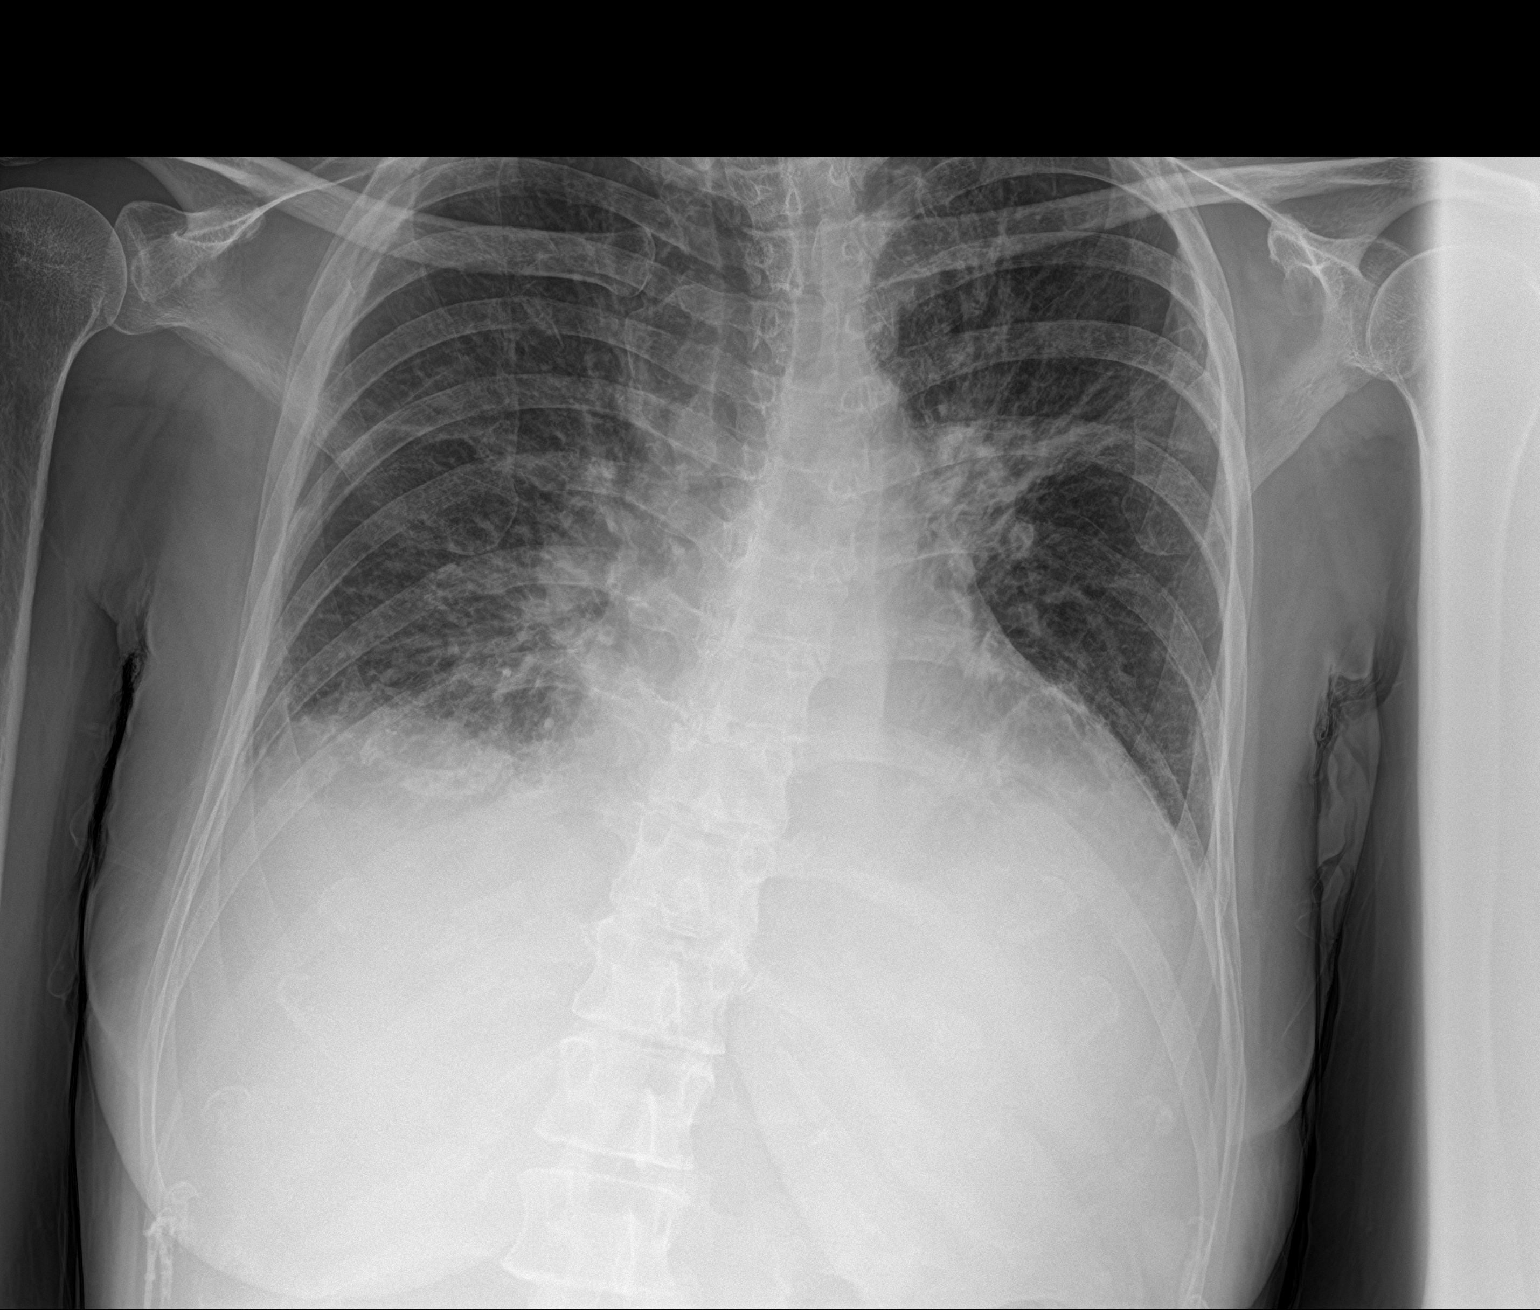

[2 of 2 positions shown; findings below may reference images not displayed]

FINDINGS: Cardiomegaly. Bilateral interstitial airspace opacities in a
predominantly perihilar distribution. Additionally, there appear to
be small bilateral layering pleural effusions and associated
bibasilar airspace opacities. Of note, the left perihilar soft
tissues are slightly more full than the right. No pneumothorax. No
acute osseous abnormality.
IMPRESSION: 1. Findings are most suggestive of mild CHF with cardiomegaly, mild
interstitial edema, small bilateral pleural effusions and bibasilar
atelectasis.
2. However, the left hilar soft tissues are slightly more prominent
than the right. While this may simply be due to atelectasis
superimposed on the underlying pulmonary edema, a follow-up PA and
lateral chest x-ray is recommended once the patient's CHF has been
treated to exclude the possibility of an underlying hilar mass.

## 2019-12-10 LAB — COLOGUARD: COLOGUARD: NEGATIVE

## 2020-01-05 IMAGING — DX DG CHEST 1V PORT
1 series · 1 of 1 positions shown · non-contrast
Comparison: Chest x-ray dated July 29, 2017.

CLINICAL DATA: Shortness of breath.

EXAM:
PORTABLE CHEST 1 VIEW

[chest ap]
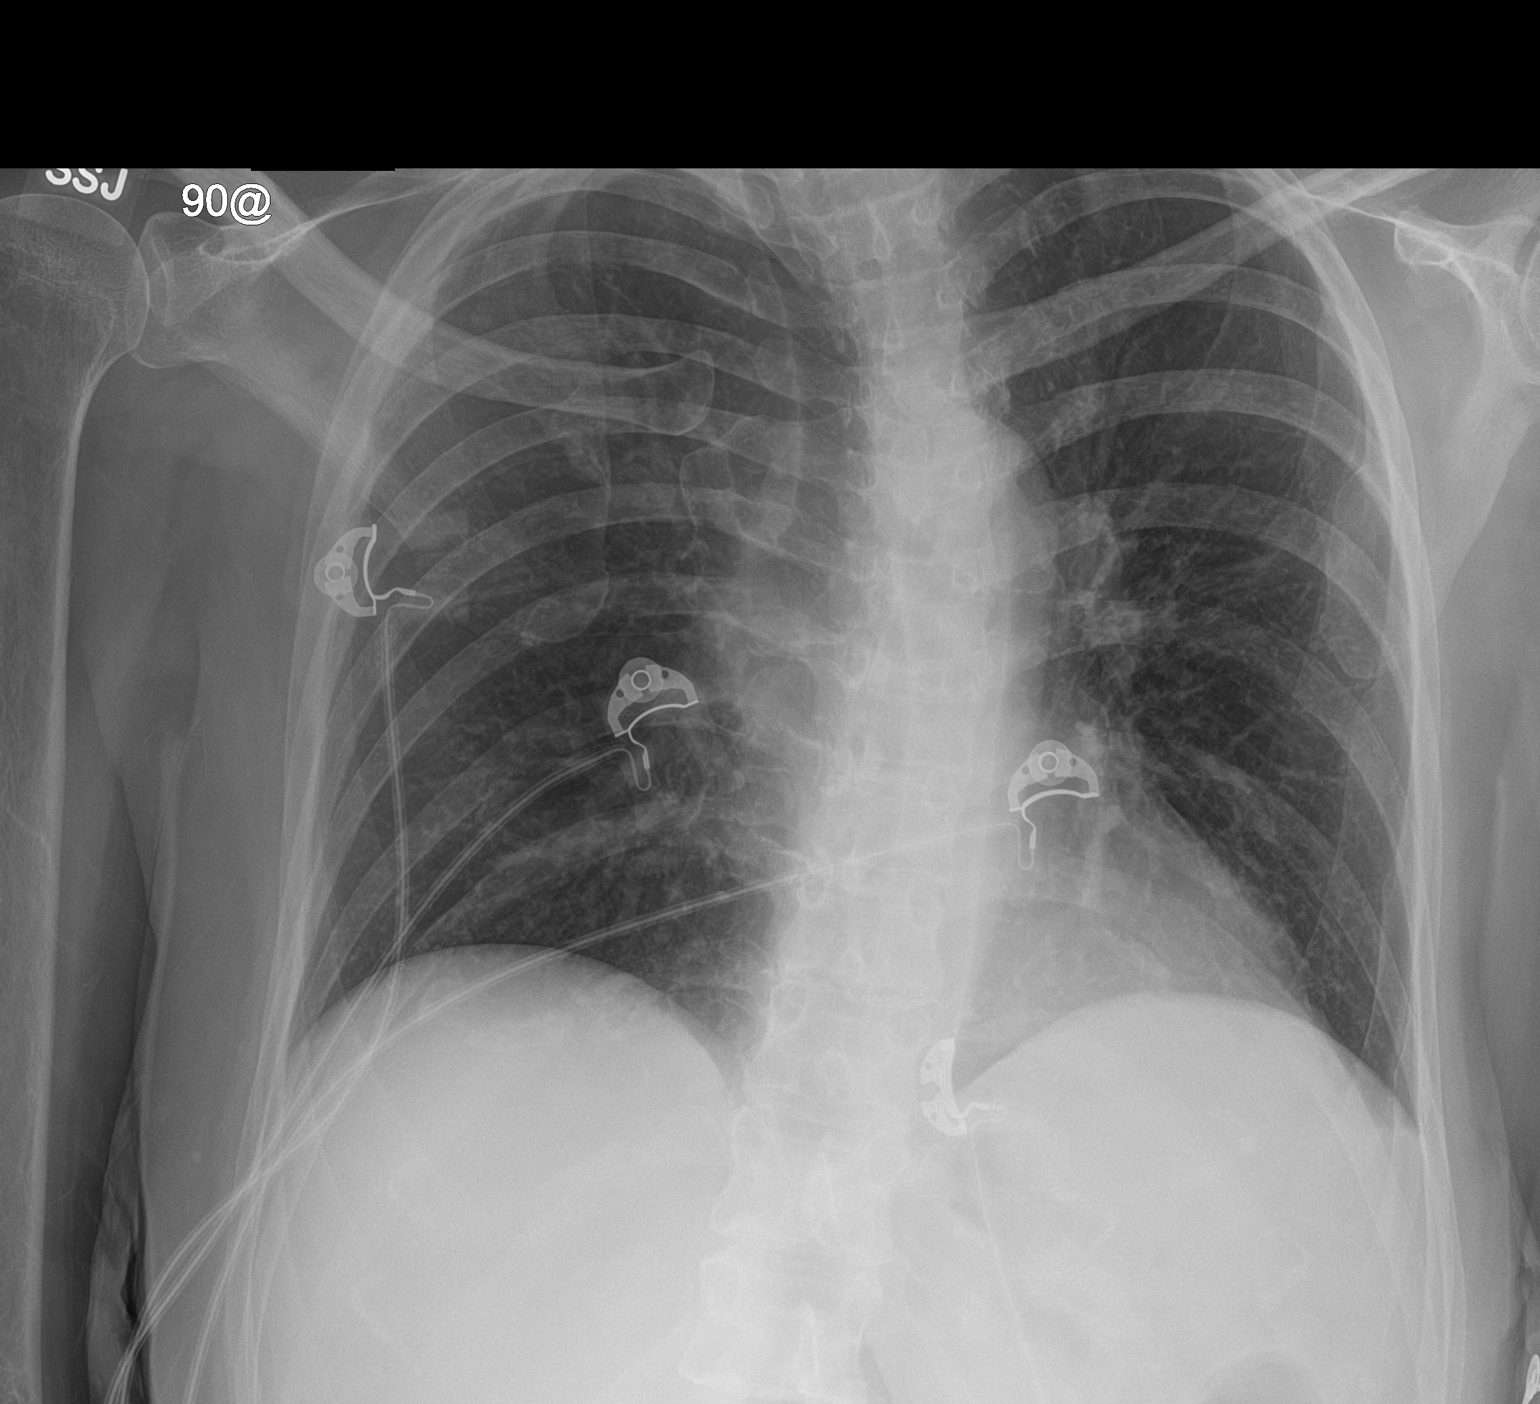

[1 of 1 positions shown; findings below may reference images not displayed]

FINDINGS: The heart size and mediastinal contours are within normal limits.
Normal pulmonary vascularity. No focal consolidation, pleural
effusion, or pneumothorax. No acute osseous abnormality.
IMPRESSION: No active disease.

## 2020-09-28 ENCOUNTER — Encounter: Payer: Self-pay | Admitting: Emergency Medicine

## 2020-09-28 ENCOUNTER — Other Ambulatory Visit: Payer: Self-pay

## 2020-09-28 ENCOUNTER — Ambulatory Visit (INDEPENDENT_AMBULATORY_CARE_PROVIDER_SITE_OTHER)

## 2020-09-28 ENCOUNTER — Ambulatory Visit
Admission: EM | Admit: 2020-09-28 | Discharge: 2020-09-28 | Disposition: A | Discharge status: Home / Self Care | Attending: Sports Medicine | Admitting: Sports Medicine

## 2020-09-28 DIAGNOSIS — S5012XA Contusion of left forearm, initial encounter: Secondary | ICD-10-CM | POA: Diagnosis not present

## 2020-09-28 DIAGNOSIS — M79632 Pain in left forearm: Secondary | ICD-10-CM | POA: Diagnosis not present

## 2020-09-28 NOTE — ED Triage Notes (Signed)
Pt presents today with c/o of left arm bruising and swelling s/p fall approx 2 weeks ago. She reports tripping over her dog striking left arm on counter.

## 2020-09-28 NOTE — Discharge Instructions (Addendum)
Your x-ray does not show a fracture.  If radiology disagrees then I will contact you. As we discussed your exam is consistent with a hematoma.  Given your liver issues and cirrhosis I suspect that your clotting factors are not operating optimally and this is giving you the bruising and the hematoma which is a collection of blood.  If you use warm compresses to try to thin that it should resolve over time. If your symptoms persist please see your primary care provider. Please see educational handouts.

## 2020-09-28 NOTE — ED Provider Notes (Signed)
[facility]    CSN: [id number] Arrival date & time: 09/28/20  1859      History   Chief Complaint Chief Complaint  Patient presents with   Arm Injury    HPI Joanna Sanchez is a 55 y.o. female.   55 year old right-hand-dominant female who presents for evaluation of an injury to her left forearm.  Her primary care needs are met by [facility].  She was unable to get an appointment.  She reports falling about 2 weeks ago in her kitchen and she tripped over her dog striking her left forearm on the counter.  Since then she has had a lot of bruising.  She is concerned because she has some swelling and some pain around that swelling over the forearm.  She does have a history of cirrhosis and has some liver issues secondary to alcohol abuse.  I am not able to view any recent labs.  Given that her symptoms are have not improved then she comes in today for initial evaluation and management.  No red flag signs or symptoms elicited on history.   Past Medical History:  Diagnosis Date   Alcohol abuse    CHF (congestive heart failure) ([place])    Liver disease    Neuropathy     Patient Active Problem List   Diagnosis Date Noted   Malnutrition of moderate degree 04/23/2018   Alcohol withdrawal delirium ([place]) 08/01/2017   Hepatic cirrhosis ([place]) 08/01/2017   Alcohol abuse 08/01/2017   CHF (congestive heart failure) ([place]) 07/29/2017    Past Surgical History:  Procedure Laterality Date   ABDOMINAL HYSTERECTOMY     BREAST LUMPECTOMY Right    TONSILLECTOMY      OB History   No obstetric history on file.      Home Medications    Prior to Admission medications   Medication Sig Start Date End Date Taking? Authorizing Provider  azelastine (OPTIVAR) 0.05 % ophthalmic solution Place 1 drop into both eyes daily. 11/22/17   [provider]  butalbital-acetaminophen-caffeine (FIORICET, ESGIC) 50-325-40 MG tablet Take 1 tablet by mouth 2 (two) times daily as  needed for headache. 04/21/18   [provider]  carvedilol (COREG) 12.5 MG tablet Take 12.5 mg by mouth 2 (two) times daily with a meal.    [provider]  cetirizine (ZYRTEC) 10 MG tablet Take 10 mg by mouth daily. 02/23/18   [provider]  cyclobenzaprine (FLEXERIL) 5 MG tablet Take 5 mg by mouth 3 (three) times daily as needed for muscle spasms. 03/29/18   [provider]  DULoxetine (CYMBALTA) 60 MG capsule Take 120 mg by mouth daily.     [provider]  ferrous sulfate 325 (65 FE) MG EC tablet Take 1 tablet (325 mg total) by mouth 2 (two) times daily. 04/25/18 04/25/19  Saundra Shelling, MD  furosemide (LASIX) 40 MG tablet Take 40 mg by mouth daily.    [provider]  magnesium oxide (MAGOX 400) 400 (241.3 Mg) MG tablet Take 400 mg by mouth daily.    [provider]  Multiple Vitamins-Minerals (CENTRUM ULTRA WOMENS) TABS Take 1 tablet by mouth daily.    [provider]  pantoprazole (PROTONIX) 40 MG tablet Take 1 tablet by mouth 2 (two) times daily. 05/09/17   [provider]  potassium chloride SA (K-DUR,KLOR-CON) 20 MEQ tablet Take 1 tablet by mouth daily. 11/22/17   [provider]  QUEtiapine (SEROQUEL) 100 MG tablet Take 100 mg by  mouth at bedtime.    [provider]  rifaximin (XIFAXAN) 550 MG TABS tablet Take 550 mg by mouth 2 (two) times daily.    [provider]  sucralfate (CARAFATE) 1 g tablet Take 1 tablet by mouth 4 (four) times daily. 07/03/17   [provider]  thiamine 100 MG tablet Take 100 mg by mouth daily.    [provider]    Family History Family History  Problem Relation Age of Onset   Diabetes Father     Social History Social History   Tobacco Use   Smoking status: Never   Smokeless tobacco: Never  Substance Use Topics   Alcohol use: Yes   Drug use: No     Allergies   Eliquis [apixaban], Niacin, Niacin and related, Other, and  Statins   Review of Systems Review of Systems  Constitutional:  Positive for activity change. Negative for appetite change, chills, diaphoresis, fatigue and fever.  HENT:  Negative for congestion, ear pain, postnasal drip, rhinorrhea, sinus pressure, sinus pain, sneezing and sore throat.   Eyes:  Negative for pain.  Respiratory:  Negative for cough, chest tightness and shortness of breath.   Cardiovascular:  Negative for chest pain and palpitations.  Gastrointestinal:  Negative for abdominal pain, diarrhea, nausea and vomiting.  Genitourinary:  Negative for dysuria.  Musculoskeletal:  Positive for arthralgias. Negative for back pain, myalgias and neck pain.  Skin:  Positive for color change. Negative for pallor, rash and wound.  Neurological:  Negative for dizziness, light-headedness and headaches.  Hematological:  Bruises/bleeds easily.  All other systems reviewed and are negative.   Physical Exam Triage Vital Signs ED Triage Vitals  Enc Vitals Group     BP 09/28/20 1913 124/82     Pulse Rate 09/28/20 1913 96     Resp 09/28/20 1913 18     Temp 09/28/20 1915 98.4 F (36.9 C)     Temp Source 09/28/20 1913 Oral     SpO2 09/28/20 1913 99 %     Weight --      Height --      Head Circumference --      Peak Flow --      Pain Score 09/28/20 1908 0     Pain Loc --      Pain Edu? --      Excl. in [place]? --    No data found.  Updated Vital Signs BP 124/82 (BP Location: Right Arm)   Pulse 96   Temp 98.4 F (36.9 C) (Oral)   Resp 18   SpO2 99%   Visual Acuity Right Eye Distance:   Left Eye Distance:   Bilateral Distance:    Right Eye Near:   Left Eye Near:    Bilateral Near:     Physical Exam Vitals and nursing note reviewed.  Constitutional:      General: She is not in acute distress.    Appearance: Normal appearance. She is not ill-appearing, toxic-appearing or diaphoretic.  HENT:     Head: Normocephalic and atraumatic.     Nose: Nose normal.     Mouth/Throat:      Mouth: Mucous membranes are moist.  Eyes:     Conjunctiva/sclera: Conjunctivae normal.     Pupils: Pupils are equal, round, and reactive to light.  Cardiovascular:     Rate and Rhythm: Normal rate and regular rhythm.     Pulses: Normal pulses.     Heart sounds: Normal heart sounds. No murmur  heard.   No friction rub. No gallop.  Pulmonary:     Effort: Pulmonary effort is normal.     Breath sounds: Normal breath sounds. No stridor. No wheezing, rhonchi or rales.  Musculoskeletal:     Cervical back: Normal range of motion and neck supple.  Skin:    General: Skin is warm and dry.     Capillary Refill: Capillary refill takes less than 2 seconds.     Findings: Bruising, ecchymosis and signs of injury present. No abrasion, abscess, erythema, lesion or rash.     Comments: Left forearm shows a large area of ecchymosis over the ulnar area of the forearm.  There is a small area of fluid collection secondary to the trauma which is consistent with a hematoma that is tender to palpation.  There is no active infection or drainage.  No evidence of an abscess.  She has full active range of motion of the wrist and the elbow.  Neurological:     General: No focal deficit present.     Mental Status: She is alert and oriented to person, place, and time.     UC Treatments / Results  Labs (all labs ordered are listed, but only abnormal results are displayed) Labs Reviewed - No data to display  EKG   Radiology DG Forearm Left  Result Date: 09/28/2020 CLINICAL DATA:  Fall.  Left arm bruising and swelling. EXAM: LEFT FOREARM - 2 VIEW COMPARISON:  None. FINDINGS: There is no evidence of fracture or other focal bone lesions. Old fracture deformity is seen involving the distal radial metaphysis. Generalized osteopenia noted. Focal area of soft tissue swelling is seen along the dorsal aspect of the distal ulnar diaphysis. IMPRESSION: Focal soft tissue swelling along the dorsal aspect of the distal ulnar  diaphysis. No evidence of fracture. Osteopenia. Electronically Signed   By: Marlaine Hind M.D.   On: 09/28/2020 19:41    Procedures Procedures (including critical care time)  Medications Ordered in UC Medications - No data to display  Initial Impression / Assessment and Plan / UC Course  I have reviewed the triage vital signs and the nursing notes.  Pertinent labs & imaging results that were available during my care of the patient were reviewed by me and considered in my medical decision making (see chart for details).  Clinical impression: Left forearm injury secondary to direct trauma about 2 weeks ago.  Is consistent with a contusion with a traumatic hematoma.  Cannot fully rule out a fracture.  Treatment plan: 1.  The findings and treatment plan were discussed in detail with the patient.  Patient was in agreement. 2.  I recommended we get an x-ray of the left forearm.  It was ordered and interpreted independently by myself here in the office today.  There is some focal soft tissue swelling along the dorsal aspect of the distal ulnar diaphysis.  There is no fracture.  There is associated osteopenia.  There is an old fracture deformity seen in involving the distal radial metaphysis.  We will send to radiology for overread. 3.  Educational handouts provided. 4.  I did discuss with her that she had a hematoma in that it was probably secondary to her liver issues and her cirrhosis and that her clotting factors were not operating optimally.  I wanted her to heat that area and try to thin out those blood products.  She voiced verbal understanding. 5.  If symptoms persist she should see her primary care provider. 6.  She may ultimately need to be seen by orthopedics and have it drained or incised. 7.  She was discharged in stable condition will follow-up here as needed.    Final Clinical Impressions(s) / UC Diagnoses   Final diagnoses:  Left forearm pain  Contusion of left forearm, initial  encounter  Traumatic hematoma of left forearm, initial encounter     Discharge Instructions      Your x-ray does not show a fracture.  If radiology disagrees then I will contact you. As we discussed your exam is consistent with a hematoma.  Given your liver issues and cirrhosis I suspect that your clotting factors are not operating optimally and this is giving you the bruising and the hematoma which is a collection of blood.  If you use warm compresses to try to thin that it should resolve over time. If your symptoms persist please see your primary care provider. Please see educational handouts.     ED Prescriptions   None    PDMP not reviewed this encounter.   Verda Cumins, MD 09/29/20 2043

## 2021-09-26 ENCOUNTER — Telehealth: Payer: Self-pay | Admitting: Nurse Practitioner

## 2021-09-26 NOTE — Telephone Encounter (Signed)
Spoke with patient and we discussed the Palliative referral/services.  Patient told me that she was being discharged from [facility] tomorrow and then she has a f/u with her PCP at [facility] on 09/28/21 and she wanted to discuss this with her PCP before scheduling.  I gave her my contact information and she said that she will call me back on Friday 09/29/21 to let me know if she wishes to pursue Palliative services or not

## 2021-09-29 ENCOUNTER — Telehealth: Payer: Self-pay | Admitting: Nurse Practitioner

## 2021-09-29 NOTE — Telephone Encounter (Signed)
Ret'd call to patient and discussed the Palliative referral/services with her and she was in agreement with beginning services.  I have scheduled a MyChart Palliative Consult for 10/05/21 @ 2:30 PM.

## 2021-10-05 ENCOUNTER — Telehealth: Admitting: Nurse Practitioner

## 2021-10-05 ENCOUNTER — Encounter: Payer: Self-pay | Admitting: Nurse Practitioner

## 2021-10-05 DIAGNOSIS — R630 Anorexia: Secondary | ICD-10-CM

## 2021-10-05 DIAGNOSIS — E440 Moderate protein-calorie malnutrition: Secondary | ICD-10-CM

## 2021-10-05 DIAGNOSIS — E43 Unspecified severe protein-calorie malnutrition: Secondary | ICD-10-CM

## 2021-10-05 DIAGNOSIS — Z515 Encounter for palliative care: Secondary | ICD-10-CM

## 2021-10-05 DIAGNOSIS — K7031 Alcoholic cirrhosis of liver with ascites: Secondary | ICD-10-CM

## 2021-10-05 NOTE — Progress Notes (Signed)
Therapist, nutritional Palliative Care Consult Note Telephone: [phone]  Fax: [phone]   Date of encounter: 10/05/21 4:56 PM PATIENT NAME: Joanna Sanchez [address] [place] [address]   [phone] (home)  DOB: Jul 29, 1965 MRN: [id number] PRIMARY CARE PROVIDER:    [facility], [facility],  [address] [place] [address] [phone]  REFERRING PROVIDER:   Care, [facility] [address] [place],  [place] [address] [phone]  RESPONSIBLE PARTY:    Contact Information     Name Relation Home Work Mobile   Joanna, Sanchez [phone]        Due to the COVID-19 crisis, this visit was done via telemedicine from my office and it was initiated and consent by this patient and or family.  I connected with  Joanna Sanchez OR PROXY on 10/05/21 by a telephone as video not available enabled telemedicine application and verified that I am speaking with the correct person using two identifiers.   I discussed the limitations of evaluation and management by telemedicine. The patient expressed understanding and agreed to proceed.  Palliative Care was asked to follow this patient by consultation request of  [facility], [facility] to address advance care planning and complex medical decision making. This is the initial visit.                            ASSESSMENT AND PLAN / RECOMMENDATIONS:  Symptom Management/Plan: 1. Advance Care Planning;    2. Goals of Care: Goals include to maximize quality of life and symptom management. Our advance care planning conversation included a discussion about:    The value and importance of advance care planning  Exploration of personal, cultural or spiritual beliefs that might influence medical decisions  Exploration of goals of care in the event of a sudden injury or illness  Identification and preparation of a healthcare agent  Review and updating or creation of an advance directive document.  3.  Anorexia; poor, discussed at length about nutritional education, supplements, eating patterns, with h/o gastric bypass limited ability of amount at one time. Encouraged to continue to take vitamins. Continue to monitor weights 3 weeks ago 131 lbs reported Today 120 lbs reported  4. Edema secondary to liver failure. Discussed current regimen, paracentesis with possible routine schedule if continues to re accumulate fluid, currently stable. We talked about chronic disease progression of liver failure. Ms. Joanna Sanchez endorses 6 months sober. Continue to monitor weights, edema, abdominal girth.   5. Palliative care encounter; Palliative care encounter; Palliative medicine team will continue to support patient, patient's family, and medical team. Visit consisted of counseling and education dealing with the complex and emotionally intense issues of symptom management and palliative care in the setting of serious and potentially life-threatening illness  Follow up Palliative Care Visit: Palliative care will continue to follow for complex medical decision making, advance care planning, and clarification of goals. Return 4 weeks or prn.  I spent 43 minutes providing this consultation. More than 50% of the time in this consultation was spent in counseling and care coordination. PPS: 50% Chief Complaint: Follow up palliative consult for complex medical decision making, address goals, manage ongoing symptoms  HISTORY OF PRESENT ILLNESS:  Joanna Sanchez is a 56 y.o. year old female  with multiple medical problems including h/o gastric bypass, IDA, liver cirrhosis, CHF, neuropathy, alcohol abuse, anxiety. recent hospitalization at [facility] from 09/30/2021 to 10/02/2021  for alcoholic services of liver with ascites. She had previously been under hospice services for about 6 weeks, now discharged due two goals of care are not aligned with comfort care. She was started on oxycodone 10 milligrams Q4 hours by Hospice but  then changed to Q6 hours during hospitalization with 15 days to migrate risks of withdrawal and then discontinue. Upon discharge pending Pain clinic consult. During hospitalization mild normocytic anemia with mild thrombocytopenia the remained hemodynamically stable. Mild hyponatremia with sodium of 133 remained stable. Chronic back and ankle pain with peripheral neuropathy taking gabapentin, flexeril and voltaren. A multivitamins thymine, folic acid and MVCI due to history of gastric bypass. Mood disorder with insomnia currently on Seroquel with Trazodone. She was discharged home to follow up with GI for further paracentesis. I called for telephonic telemedicine as video not available initial palliative care council. We talked about past medical history, the last time she was independent which was more than five years ago. We talked at length about her history of alcoholism. She endorses she has been sober over six months. Praised her for her accomplishment. We talked about emotional support and supportive groups. We talked about recent hospitalization with paracentesis and chronic disease progression of liver failure. We talked about pain management including pending pain clinic appointment. We talked about oxycodone at length and the goal is to wean off. We talked about nutrition which has remained poor as she has limited capacity to eat more than small amounts at each meal due to gastric bypass surgery. We talked about nutrition and vitamins importance of. We talked about Hospice and she endorses that the goals do not align that she wants to have more aggressive interventions than what Hospice provides. We talked about impact of liver cirrhosis on her three adult children and husband. We talked about her care needs as she currently is able to ambulate, get dressed, bathe without assistance. We talked about medical goals of care period we talked about overall decline with expectations with progression. We talked  about role of palliative care and plan of care period we talked about follow up palliative care visit in four weeks or sooner should she decline. In agreement and appointment scheduled. Therapeutic listening and emotional support provided. Questions answered satisfaction. Contact information provided.  History obtained from review of EMR, discussion with Ms. Perfecto.  I reviewed available labs, medications, imaging, studies and related documents from the EMR.  Records reviewed and summarized above.   ROS 10 point system reviewed all negative except HPI  Physical Exam: deferred CURRENT PROBLEM LIST:  Patient Active Problem List   Diagnosis Date Noted   Malnutrition of moderate degree 04/23/2018   Alcohol withdrawal delirium ([facility]) 08/01/2017   Hepatic cirrhosis ([facility]) 08/01/2017   Alcohol abuse 08/01/2017   CHF (congestive heart failure) ([facility]) 07/29/2017   PAST MEDICAL HISTORY:  Active Ambulatory Problems    Diagnosis Date Noted   CHF (congestive heart failure) ([facility]) 07/29/2017   Alcohol withdrawal delirium ([facility]) 08/01/2017   Hepatic cirrhosis ([facility]) 08/01/2017   Alcohol abuse 08/01/2017   Malnutrition of moderate degree 04/23/2018   Resolved Ambulatory Problems    Diagnosis Date Noted   No Resolved Ambulatory Problems   Past Medical History:  Diagnosis Date   Liver disease    Neuropathy    SOCIAL HX:  Social History   Tobacco Use   Smoking status: Never   Smokeless tobacco: Never  Substance Use Topics   Alcohol use: Yes   FAMILY HX:  Family  History  Problem Relation Age of Onset   Diabetes Father       ALLERGIES:  Allergies  Allergen Reactions   Eliquis [Apixaban] Anaphylaxis   Niacin Anaphylaxis, Dermatitis and Shortness Of Breath   Niacin And Related    Other     Patient states that she had an anaphylactic reaction to a blood thinner but does not remember the name of it    Statins Hives     PERTINENT MEDICATIONS:  Outpatient Encounter Medications as of  10/05/2021  Medication Sig   azelastine (OPTIVAR) 0.05 % ophthalmic solution Place 1 drop into both eyes daily.   butalbital-acetaminophen-caffeine (FIORICET, ESGIC) 50-325-40 MG tablet Take 1 tablet by mouth 2 (two) times daily as needed for headache.   carvedilol (COREG) 12.5 MG tablet Take 12.5 mg by mouth 2 (two) times daily with a meal.   cetirizine (ZYRTEC) 10 MG tablet Take 10 mg by mouth daily.   cyclobenzaprine (FLEXERIL) 5 MG tablet Take 5 mg by mouth 3 (three) times daily as needed for muscle spasms.   DULoxetine (CYMBALTA) 60 MG capsule Take 120 mg by mouth daily.    ferrous sulfate 325 (65 FE) MG EC tablet Take 1 tablet (325 mg total) by mouth 2 (two) times daily.   furosemide (LASIX) 40 MG tablet Take 40 mg by mouth daily.   magnesium oxide (MAGOX 400) 400 (241.3 Mg) MG tablet Take 400 mg by mouth daily.   Multiple Vitamins-Minerals (CENTRUM ULTRA WOMENS) TABS Take 1 tablet by mouth daily.   pantoprazole (PROTONIX) 40 MG tablet Take 1 tablet by mouth 2 (two) times daily.   potassium chloride SA (K-DUR,KLOR-CON) 20 MEQ tablet Take 1 tablet by mouth daily.   QUEtiapine (SEROQUEL) 100 MG tablet Take 100 mg by mouth at bedtime.   rifaximin (XIFAXAN) 550 MG TABS tablet Take 550 mg by mouth 2 (two) times daily.   sucralfate (CARAFATE) 1 g tablet Take 1 tablet by mouth 4 (four) times daily.   thiamine 100 MG tablet Take 100 mg by mouth daily.   No facility-administered encounter medications on file as of 10/05/2021.   Thank you for the opportunity to participate in the care of Ms. Kassin.  The palliative care team will continue to follow. Please call our office at [phone] if we can be of additional assistance.   Francely Craw Z Wynter Grave, NP ,

## 2021-11-02 ENCOUNTER — Telehealth: Admitting: Nurse Practitioner

## 2021-11-02 ENCOUNTER — Encounter: Payer: Self-pay | Admitting: Nurse Practitioner

## 2021-11-02 DIAGNOSIS — E43 Unspecified severe protein-calorie malnutrition: Secondary | ICD-10-CM

## 2021-11-02 DIAGNOSIS — Z515 Encounter for palliative care: Secondary | ICD-10-CM

## 2021-11-02 DIAGNOSIS — E440 Moderate protein-calorie malnutrition: Secondary | ICD-10-CM

## 2021-11-02 DIAGNOSIS — F1020 Alcohol dependence, uncomplicated: Secondary | ICD-10-CM

## 2021-11-02 NOTE — Progress Notes (Signed)
Therapist, nutritional Palliative Care Consult Note Telephone: [phone]  Fax: [phone]    Date of encounter: 11/02/21 1:14 PM PATIENT NAME: Chenee Munns [address] [place] [address]   [phone] (home)  DOB: [birth year] MRN: [id number] PRIMARY CARE PROVIDER:    Care, [facility],  [address] [place] [address] [phone]  REFERRING PROVIDER:   Care, [facility] [address] [place],  [place] [address] [phone]  RESPONSIBLE PARTY:    Contact Information     Name Relation Home Work Mobile   KAVINA, CANTAVE [phone]         Due to the COVID-19 crisis, this visit was done via telemedicine from my office and it was initiated and consent by this patient and or family.   I connected with  [name] OR PROXY on 10/05/21 by a telephone as video not available enabled telemedicine application and verified that I am speaking with the correct person using two identifiers.   I discussed the limitations of evaluation and management by telemedicine. The patient expressed understanding and agreed to proceed.  Palliative Care was asked to follow this patient by consultation request of  Care, [facility] to address advance care planning and complex medical decision making. This is the initial visit.                            ASSESSMENT AND PLAN / RECOMMENDATIONS:  Symptom Management/Plan: 1. Advance Care Planning;  full code   2. Anorexia; improving, varies each day. Discussed at length about nutritional education, supplements, eating patterns, with h/o gastric bypass limited ability of amount at one time. Encouraged to continue to take vitamins. Continue to monitor weights 10/05/2021 weight 120 lbs reported 11/02/2021 weight 115 lbs reported 3. Alcoholism; stable, 10/2021 she is 6 months sober, recommended counseling for challenges that arise, Ms. Lemmons in agreement will ask PC SW to assist with counseling.  Recommended AA meeting, gave information can be in person or zoom.    4. Edema secondary to liver failure. Improving Discussed current regimen, paracentesis with possible routine schedule if continues to re accumulate fluid, currently stable.    5. Palliative care encounter; Palliative care encounter; Palliative medicine team will continue to support patient, patient's family, and medical team. Visit consisted of counseling and education dealing with the complex and emotionally intense issues of symptom management and palliative care in the setting of serious and potentially life-threatening illness   Follow up Palliative Care Visit: Palliative care will continue to follow for complex medical decision making, advance care planning, and clarification of goals. Return 8 weeks or prn.   I spent 32 minutes providing this consultation. More than 50% of the time in this consultation was spent in counseling and care coordination. PPS: 50% Chief Complaint: Follow up palliative consult for complex medical decision making, address goals, manage ongoing symptoms   HISTORY OF PRESENT ILLNESS:  Lamyra Malcolm is a 56 y.o. year old female  with multiple medical problems including h/o gastric bypass, IDA, liver cirrhosis, CHF, neuropathy, alcohol abuse, anxiety. recent hospitalization at [facility] from 09/30/2021 to 10/02/2021 for alcoholic services of liver with ascites. She had previously been under hospice services for about 6 weeks, now discharged due two goals of care are not aligned with comfort care. I called for telephonic telemedicine as video not available follow up palliative care consult. We talked about how she has  been feeling, improving strength. We talked about mobility which continues to improve, no falls. We talked about pain which she is out of oxycodone, continues to use gabapentin with relief for neuropathy. Ms. Zahniser endorses she recently took her grand-daughter school clothes shopping, was  tired after. She did not drive. We talked about no recent hospitalizations, infections, wounds. We talked about appetite improving with variation of weights, edema continues to improve. We talked about quality of life, alcholism including counseling, AA meetings. We talked about medical goals of care period we talked about overall decline with expectations with progression. We talked about role of palliative care and plan of care. Therapeutic listening and emotional support provided. Questions answered satisfaction. Contact information provided.   History obtained from review of EMR, discussion with Ms. Resendes.  I reviewed available labs, medications, imaging, studies and related documents from the EMR.  Records reviewed and summarized above.    ROS 10 point system reviewed all negative except HPI   Physical Exam: deferred  Thank you for the opportunity to participate in the care of Ms. Balch.  The palliative care team will continue to follow. Please call our office at [phone] if we can be of additional assistance.   Yunus Stoklosa Prince Rome, NP

## 2021-11-03 ENCOUNTER — Ambulatory Visit

## 2021-11-03 DIAGNOSIS — Z789 Other specified health status: Secondary | ICD-10-CM

## 2021-11-03 NOTE — Progress Notes (Signed)
PC SW outreached patient per [facility] NP - C. Gusler SW referral request to assess and discuss patients MH needs.  Patient states that she does have anxiety every now and then, with no proper coping mechanisms. She has been taking Cymbalta for moodiness for some time now. She has participated in virtual counseling at [facility] some time ago, but does not wish to pursue their services at this time. She prefers to have virtual interactions due to not driving currently and spouse needing to take off work to take her to her counseling sessions. SW and patient discussed her alcohol sobriety thus far being 6 months and the importance of having support groups in place to assist in maintaining sobriety along with a relapse recovery plan. Patient in agreement but still slightly hesitant about initiating this form of support at this time  Patient open to SW mailing her resources on local counseling services that may offer virtual sessions as well as AA meetings.  SW to mail the following resources to patient:  Local Counseling agencies  [employer], [place]  [place] [place], [place]  T: [phone]  F: [phone]  [address]  [place]   [facility] [place]  P: [phone]  Prefer for patient to call themselves to set up first appointment. First appointment must be in person all others can be virtual.    AA Meetings  Came To Believe Group Monday, 9:00 am to 10:00 am This meeting is online only. Zoom Meeting ID: [id number] Password: [id number]  Sobriety 101 Group Monday, 8:00 pm to 9:00 pm [employer] Meeting: [email or web address] Meeting ID: [id number] Passcode: itworks One tap mobile: +1 [phone] [place] ([place]) Meeting ID: [phone] Passcode: [email or web address] Find your local number: [email or web address]  H.O.W. Beginners Group Thursday, 7:00 pm to 8:00 pm Zoom ID: [phone] Contact information: Fara Boros [phone]  Global Ladies  AA Meeting Zoom OPEN WOMENS MEETING We are an international group of ladies hosting meetings EVERYDAY on Zoom. Join [place]. - Monday 11:30am (EST) - Living Sober Book Study and Discussion. - Tuesday 11:30am (EST) - Deep dive into Steps 10, 11 and 12. Reading from the Big Book and the Twelve Steps and Twelve Traditions + Meditation. - Wednesday 11:30am (EST) - Voices of Women in [place] Book Study and Discussion. - Thursday 11:30am (EST) - Share Your experience, Strength and Hope on the Daily Reflections and other AA literature. - Friday 11:30am (EST) - Speaker Meeting and Discussion on AA approved literature. -Saturday 12:00pm (EST) - Beginner's Study and Discussion of Steps 1, 2 & 3. - Sunday 12:00pm (EST) - Experience, Strength and Hope Book Study and Discussion. Join [place] for fellowship before and after the meeting. Anyone who identifies as a woman and has a desire to stop drinking or an interest in [place], is welcome! Meeting ID: [id number] Password: [email or web address]   Patient had no other needs/concerns for SW at this time.

## 2021-12-26 ENCOUNTER — Encounter: Payer: Self-pay | Admitting: Nurse Practitioner

## 2021-12-26 ENCOUNTER — Telehealth: Admitting: Nurse Practitioner

## 2021-12-26 DIAGNOSIS — E43 Unspecified severe protein-calorie malnutrition: Secondary | ICD-10-CM

## 2021-12-26 DIAGNOSIS — E440 Moderate protein-calorie malnutrition: Secondary | ICD-10-CM

## 2021-12-26 DIAGNOSIS — R630 Anorexia: Secondary | ICD-10-CM

## 2021-12-26 DIAGNOSIS — Z515 Encounter for palliative care: Secondary | ICD-10-CM

## 2021-12-26 NOTE — Progress Notes (Signed)
[facility] Consult Note Telephone: [phone]  Fax: [phone]    Date of encounter: 12/26/21 9:23 PM PATIENT NAME: Joanna Sanchez [address] [place] [address]   [phone] (home)  DOB: [birth year] MRN: [id number] PRIMARY CARE PROVIDER:    Care, [facility],  [address] [place] [address] [phone]  REFERRING PROVIDER:   Care, [facility] [address] [place],  [place] [address] [phone]  RESPONSIBLE PARTY:    Contact Information     Name Relation Home Work Mobile   BAKER, KOGLER [phone]             Due to the COVID-19 crisis, this visit was done via telemedicine from my office and it was initiated and consent by this patient and or family.   I connected with  Joanna Sanchez OR PROXY on 10/05/21 by a telephone as video not available enabled telemedicine application and verified that I am speaking with the correct person using two identifiers.   I discussed the limitations of evaluation and management by telemedicine. The patient expressed understanding and agreed to proceed.  Palliative Care was asked to follow this patient by consultation request of  Care, [facility] to address advance care planning and complex medical decision making. This is the initial visit.                            ASSESSMENT AND PLAN / RECOMMENDATIONS:  Symptom Management/Plan: 1. Advance Care Planning;  full code   2. Anorexia; improving, varies each day. Discussed at length about nutritional education, supplements, eating patterns, with h/o gastric bypass limited ability of amount at one time. Encouraged to continue to take vitamins. Continue to monitor weights 10/05/2021 weight 120 lbs reported 11/02/2021 weight 115 lbs reported Reported weight 120 lbs 3. Alcoholism; stable, 10/2021 she is 6 months sober, recommended counseling for challenges that arise, Joanna Sanchez in agreement will ask PC SW  to assist with counseling. Recommended AA meeting, gave information can be in person or zoom.    4. Edema secondary to liver failure. Improving Discussed current regimen, paracentesis with possible routine schedule if continues to re accumulate fluid, currently stable.    5. Palliative care encounter; Palliative care encounter; Palliative medicine team will continue to support patient, patient's family, and medical team. Visit consisted of counseling and education dealing with the complex and emotionally intense issues of symptom management and palliative care in the setting of serious and potentially life-threatening illness   Follow up Palliative Care Visit: Palliative care will continue to follow for complex medical decision making, advance care planning, and clarification of goals. Return 8 weeks or prn.   I spent 35 minutes providing this consultation. More than 50% of the time in this consultation was spent in counseling and care coordination. PPS: 50% Chief Complaint: Follow up palliative consult for complex medical decision making, address goals, manage ongoing symptoms   HISTORY OF PRESENT ILLNESS:  Joanna Sanchez is a 56 y.o. year old female  with multiple medical problems including h/o gastric bypass, IDA, liver cirrhosis, CHF, neuropathy, alcohol abuse, anxiety. recent hospitalization at [facility] from 09/30/2021 to 10/12/9468 for alcoholic services of liver with ascites. She had previously been under hospice services for about 6 weeks, now discharged due two goals of care are not aligned with comfort care. I called for telephonic telemedicine as video not available follow up palliative  care consult.   We talked about role of palliative care and plan of care with f/u visits with PC RN/PC SW with [facility] NP signing off as that consult has been completed. Therapeutic listening and emotional support provided. Questions answered satisfaction. Contact information provided.  History obtained from  review of EMR, discussion with Joanna Sanchez.  I reviewed available labs, medications, imaging, studies and related documents from the EMR.  Records reviewed and summarized above.   ROS 10 point system reviewed all negative except HPI  Physical Exam: deferred Thank you for the opportunity to participate in the care of Joanna Sanchez.  The palliative care team will continue to follow. Please call our office at [phone] if we can be of additional assistance.   [place] Prince [place], NP

## 2022-09-22 LAB — COLOGUARD: COLOGUARD: NEGATIVE

## 2024-04-26 DEATH — deceased
